# Patient Record
Sex: Male | Born: 1980 | Race: White | Hispanic: No | Marital: Single | Smoking: Current every day smoker
Health system: Southern US, Community
[De-identification: ages and names within clinical notes are randomized; demographics above are authoritative.]

## PROBLEM LIST (undated history)

## (undated) DIAGNOSIS — J189 Pneumonia, unspecified organism: Secondary | ICD-10-CM

## (undated) HISTORY — PX: KNEE SURGERY: SHX244

## (undated) HISTORY — PX: CIRCUMCISION: SUR203

## (undated) HISTORY — PX: FOOT SURGERY: SHX648

---
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
## 2002-09-11 ENCOUNTER — Emergency Department (HOSPITAL_COMMUNITY): Admission: EM | Admit: 2002-09-11 | Discharge: 2002-09-11 | Payer: Self-pay | Admitting: Emergency Medicine

## 2002-09-11 ENCOUNTER — Encounter: Payer: Self-pay | Admitting: Emergency Medicine

## 2002-09-24 ENCOUNTER — Ambulatory Visit (HOSPITAL_BASED_OUTPATIENT_CLINIC_OR_DEPARTMENT_OTHER): Admission: RE | Admit: 2002-09-24 | Discharge: 2002-09-24 | Payer: Self-pay | Admitting: Urology

## 2002-10-05 ENCOUNTER — Encounter: Payer: Self-pay | Admitting: Emergency Medicine

## 2002-10-05 ENCOUNTER — Emergency Department (HOSPITAL_COMMUNITY): Admission: EM | Admit: 2002-10-05 | Discharge: 2002-10-05 | Payer: Self-pay | Admitting: Emergency Medicine

## 2002-12-23 ENCOUNTER — Encounter: Payer: Self-pay | Admitting: Emergency Medicine

## 2002-12-23 ENCOUNTER — Emergency Department (HOSPITAL_COMMUNITY): Admission: EM | Admit: 2002-12-23 | Discharge: 2002-12-23 | Payer: Self-pay | Admitting: Emergency Medicine

## 2004-08-02 ENCOUNTER — Emergency Department (HOSPITAL_COMMUNITY): Admission: EM | Admit: 2004-08-02 | Discharge: 2004-08-02 | Payer: Self-pay | Admitting: Emergency Medicine

## 2004-09-27 ENCOUNTER — Emergency Department (HOSPITAL_COMMUNITY): Admission: EM | Admit: 2004-09-27 | Discharge: 2004-09-27 | Payer: Self-pay | Admitting: Emergency Medicine

## 2007-03-18 ENCOUNTER — Emergency Department (HOSPITAL_COMMUNITY): Admission: EM | Admit: 2007-03-18 | Discharge: 2007-03-18 | Payer: Self-pay | Admitting: Emergency Medicine

## 2010-04-16 ENCOUNTER — Emergency Department (HOSPITAL_BASED_OUTPATIENT_CLINIC_OR_DEPARTMENT_OTHER)
Admission: EM | Admit: 2010-04-16 | Discharge: 2010-04-16 | Payer: Self-pay | Source: Home / Self Care | Admitting: Emergency Medicine

## 2010-09-03 NOTE — Op Note (Signed)
NAME:  [name], Juan Mann                         ACCOUNT NO.:  [id number]   MEDICAL RECORD NO.:  [id number]                   PATIENT TYPE:  AMB   LOCATION:  [facility]                                 FACILITY:  [facility]   PHYSICIAN:  Excell Seltzer. Annabell Howells, M.D.                 DATE OF BIRTH:  1981/04/18   DATE OF PROCEDURE:  09/24/2002  DATE OF DISCHARGE:                                 OPERATIVE REPORT   PROCEDURE:  Right hydrocelectomy.   PREOPERATIVE DIAGNOSIS:  Traumatic right hydrocele.   POSTOPERATIVE DIAGNOSIS:  Traumatic right hydrocele.   SURGEON:  Excell Seltzer. Annabell Howells, M.D.   ANESTHESIA:  General.   DRAINS:  Penrose drain.   COMPLICATIONS:  None.   INDICATIONS:  Onyx is a 30 year old white male who had the sudden onset of  right scrotal swelling and pain after heavy lifting at work.  On evaluation  with ultrasound he was found to have a large hydrocele and otherwise  unremarkable testicle.  It was felt that hydrocelectomy was indicated.   FINDINGS AND PROCEDURE:  The patient was taken to the operating room, where  a general anesthetic was induced.  He was placed in the supine position.  His anterior scrotum and right inguinal area were shaved, as I was not sure  whether this might not have a communication or possibly be a small hernia.  An oblique incision was made over the hydrocele, which had a somewhat  interesting dumbbell configuration.  The incision was made obliquely on the  right anterior scrotum.  The dartos was incised with the Bovie and the  hydrocele sac was delivered from the wound.  The hydrocele sac was opened.  Close inspection revealed no communicating tract with the inguinal area.  Some residual excess hydrocele sac was excised and then the hydrocele was  imbricated behind the testicle in a water bottle fashion.  At this point the  residual epithelium was lightly fulgurated with the Bovie.  Hemostasis was  assured.  The testicle was returned to the right hemiscrotum.   A quarter-  inch Penrose drain was placed through a separate stab wound in the inferior  aspect of the scrotum.  The wound was closed with a running 3-0 chromic on  the dartos and interrupted vertical mattress on the skin.  The drain was  trimmed once it was felt certain it was not secured with suture, and a  dressing was applied followed by an athletic supporter.  The patient  tolerated the procedure well.  His anesthetic was reversed.  He was taken to  the recovery room in stable condition.  There were no complications.                                               Excell Seltzer.  Annabell Howells, M.D.    JJW/MEDQ  D:  09/24/2002  T:  09/24/2002  Job:  [id number]   cc:   [facility]

## 2010-10-21 ENCOUNTER — Emergency Department (HOSPITAL_BASED_OUTPATIENT_CLINIC_OR_DEPARTMENT_OTHER)
Admission: EM | Admit: 2010-10-21 | Discharge: 2010-10-21 | Disposition: A | Discharge status: Home / Self Care | Payer: Self-pay | Attending: Emergency Medicine | Admitting: Emergency Medicine

## 2010-10-21 ENCOUNTER — Emergency Department (INDEPENDENT_AMBULATORY_CARE_PROVIDER_SITE_OTHER): Payer: Self-pay

## 2010-10-21 DIAGNOSIS — IMO0002 Reserved for concepts with insufficient information to code with codable children: Secondary | ICD-10-CM | POA: Insufficient documentation

## 2010-10-21 DIAGNOSIS — M25549 Pain in joints of unspecified hand: Secondary | ICD-10-CM

## 2010-10-21 DIAGNOSIS — J45909 Unspecified asthma, uncomplicated: Secondary | ICD-10-CM | POA: Insufficient documentation

## 2010-10-21 DIAGNOSIS — S6000XA Contusion of unspecified finger without damage to nail, initial encounter: Secondary | ICD-10-CM | POA: Insufficient documentation

## 2010-12-27 ENCOUNTER — Encounter: Payer: Self-pay | Admitting: *Deleted

## 2010-12-27 ENCOUNTER — Other Ambulatory Visit: Payer: Self-pay

## 2010-12-27 ENCOUNTER — Emergency Department (INDEPENDENT_AMBULATORY_CARE_PROVIDER_SITE_OTHER): Payer: Self-pay

## 2010-12-27 ENCOUNTER — Emergency Department (HOSPITAL_BASED_OUTPATIENT_CLINIC_OR_DEPARTMENT_OTHER)
Admission: EM | Admit: 2010-12-27 | Discharge: 2010-12-27 | Disposition: A | Discharge status: Home / Self Care | Payer: Self-pay | Attending: Emergency Medicine | Admitting: Emergency Medicine

## 2010-12-27 DIAGNOSIS — R509 Fever, unspecified: Secondary | ICD-10-CM

## 2010-12-27 DIAGNOSIS — J189 Pneumonia, unspecified organism: Secondary | ICD-10-CM

## 2010-12-27 DIAGNOSIS — R079 Chest pain, unspecified: Secondary | ICD-10-CM

## 2010-12-27 DIAGNOSIS — R05 Cough: Secondary | ICD-10-CM

## 2010-12-27 DIAGNOSIS — J3489 Other specified disorders of nose and nasal sinuses: Secondary | ICD-10-CM | POA: Insufficient documentation

## 2010-12-27 LAB — URINE MICROSCOPIC-ADD ON

## 2010-12-27 LAB — CBC
HCT: 42.1 % (ref 39.0–52.0)
MCV: 86.1 fL (ref 78.0–100.0)
Platelets: 282 10*3/uL (ref 150–400)
RBC: 4.89 MIL/uL (ref 4.22–5.81)

## 2010-12-27 LAB — URINALYSIS, ROUTINE W REFLEX MICROSCOPIC
Specific Gravity, Urine: 1.037 — ABNORMAL HIGH (ref 1.005–1.030)
Urobilinogen, UA: 1 mg/dL (ref 0.0–1.0)

## 2010-12-27 LAB — BASIC METABOLIC PANEL
BUN: 11 mg/dL (ref 6–23)
CO2: 24 mEq/L (ref 19–32)
Calcium: 9.9 mg/dL (ref 8.4–10.5)
Glucose, Bld: 101 mg/dL — ABNORMAL HIGH (ref 70–99)
Sodium: 140 mEq/L (ref 135–145)

## 2010-12-27 LAB — DIFFERENTIAL
Basophils Absolute: 0 10*3/uL (ref 0.0–0.1)
Eosinophils Relative: 1 % (ref 0–5)
Monocytes Absolute: 0.9 10*3/uL (ref 0.1–1.0)
Neutrophils Relative %: 81 % — ABNORMAL HIGH (ref 43–77)

## 2010-12-27 MED ORDER — ALBUTEROL SULFATE HFA 108 (90 BASE) MCG/ACT IN AERS
2.0000 | INHALATION_SPRAY | RESPIRATORY_TRACT | Status: DC | PRN
  Administered 2010-12-27: 2 via RESPIRATORY_TRACT
  Filled 2010-12-27: qty 6.7

## 2010-12-27 MED ORDER — SODIUM CHLORIDE 0.9 % IV BOLUS (SEPSIS)
1000.0000 mL | Freq: Once | INTRAVENOUS | Status: AC
  Administered 2010-12-27: 1000 mL via INTRAVENOUS

## 2010-12-27 MED ORDER — DEXTROSE 5 % IV SOLN
500.0000 mg | Freq: Once | INTRAVENOUS | Status: AC
  Administered 2010-12-27: 500 mg via INTRAVENOUS
  Filled 2010-12-27: qty 500

## 2010-12-27 MED ORDER — SODIUM CHLORIDE 0.9 % IV SOLN
INTRAVENOUS | Status: DC
  Administered 2010-12-27: 13:00:00 via INTRAVENOUS

## 2010-12-27 MED ORDER — AZITHROMYCIN 250 MG PO TABS: 250.0000 mg | ORAL_TABLET | Freq: Every day | ORAL | Status: AC

## 2010-12-27 NOTE — ED Provider Notes (Signed)
History     CSN: [id number] Arrival date & time: 12/27/2010  9:26 AM  Chief Complaint  Patient presents with  . Fever  . Nasal Congestion  . Cough   HPI Pt reports about 2 weeks of progressively worsening, cough, congestion, general malaise, subjective fever. Cough is productive of thick sputum, similar to prior pneumonia. He has recently started smoking again. He has had some intermittent sharp L sided chest pains worse with movement, palpation.  Past Medical History  Diagnosis Date  . Asthma     History reviewed. No pertinent past surgical history.  History reviewed. No pertinent family history.  History  Substance Use Topics  . Smoking status: Current Everyday Smoker  . Smokeless tobacco: Not on file  . Alcohol Use:       Review of Systems All other systems reviewed and are negative except as noted in HPI.   Physical Exam  BP 134/85  Pulse 78  Temp(Src) 98 F (36.7 C) (Oral)  Resp 20  SpO2 100%  Physical Exam  Nursing note and vitals reviewed. Constitutional: He is oriented to person, place, and time. He appears well-developed and well-nourished.  HENT:  Head: Normocephalic and atraumatic.  Eyes: EOM are normal. Pupils are equal, round, and reactive to light.  Neck: Normal range of motion. Neck supple.  Cardiovascular: Normal rate, normal heart sounds and intact distal pulses.   Pulmonary/Chest: Effort normal. No respiratory distress. He has wheezes. He has rales.  Abdominal: Bowel sounds are normal. He exhibits no distension. There is no tenderness.  Musculoskeletal: Normal range of motion. He exhibits no edema and no tenderness.  Neurological: He is alert and oriented to person, place, and time. He has normal strength. No cranial nerve deficit or sensory deficit.  Skin: Skin is warm and dry. No rash noted.  Psychiatric: He has a normal mood and affect.    ED Course  Procedures  MDM  Date: 12/27/2010  Rate: 58  Rhythm: normal sinus rhythm  QRS Axis:  normal  Intervals: PR shortened  ST/T Wave abnormalities: normal  Conduction Disutrbances:none  Narrative Interpretation:   Old EKG Reviewed: unchanged from 02 Aug 2004   12:59 PM CXR shows pneumonia, mild leukocytosis. Pt feeling better after IVF. Ready to go home. Will give Zithromax IV prior to discharge. Albuterol given in the ED to take home. Advised to stop smoking.      Charles B. Bernette Mayers, MD 12/27/10 1300

## 2010-12-27 NOTE — ED Notes (Signed)
Pt amb to room 7 with slow, steady gait in nad.  Pt reports 2 weeks of cough, congestion and fevers.  Also c/o left sided chest pain off and on x 1 year, describes as "sharp", tender to touch, ekg performed while pt being triaged.

## 2011-01-25 LAB — URINALYSIS, ROUTINE W REFLEX MICROSCOPIC
Glucose, UA: NEGATIVE
Specific Gravity, Urine: 1.028
pH: 6

## 2011-01-29 ENCOUNTER — Emergency Department (HOSPITAL_BASED_OUTPATIENT_CLINIC_OR_DEPARTMENT_OTHER)
Admission: EM | Admit: 2011-01-29 | Discharge: 2011-01-29 | Disposition: A | Discharge status: Home / Self Care | Payer: Self-pay | Attending: Emergency Medicine | Admitting: Emergency Medicine

## 2011-01-29 ENCOUNTER — Encounter (HOSPITAL_BASED_OUTPATIENT_CLINIC_OR_DEPARTMENT_OTHER): Payer: Self-pay | Admitting: *Deleted

## 2011-01-29 DIAGNOSIS — J40 Bronchitis, not specified as acute or chronic: Secondary | ICD-10-CM | POA: Insufficient documentation

## 2011-01-29 DIAGNOSIS — R0602 Shortness of breath: Secondary | ICD-10-CM | POA: Insufficient documentation

## 2011-01-29 DIAGNOSIS — J45909 Unspecified asthma, uncomplicated: Secondary | ICD-10-CM | POA: Insufficient documentation

## 2011-01-29 HISTORY — DX: Pneumonia, unspecified organism: J18.9

## 2011-01-29 MED ORDER — AZITHROMYCIN 250 MG PO TABS: ORAL_TABLET | ORAL | Status: DC

## 2011-01-29 MED ORDER — DEXAMETHASONE 2 MG PO TABS: ORAL_TABLET | ORAL | Status: DC

## 2011-01-29 MED ORDER — ALBUTEROL SULFATE (5 MG/ML) 0.5% IN NEBU
5.0000 mg | INHALATION_SOLUTION | Freq: Once | RESPIRATORY_TRACT | Status: AC
  Administered 2011-01-29: 5 mg via RESPIRATORY_TRACT
  Filled 2011-01-29: qty 1

## 2011-01-29 MED ORDER — IPRATROPIUM BROMIDE 0.02 % IN SOLN
0.5000 mg | Freq: Once | RESPIRATORY_TRACT | Status: AC
  Administered 2011-01-29: 0.5 mg via RESPIRATORY_TRACT
  Filled 2011-01-29: qty 2.5

## 2011-01-29 MED ORDER — DEXAMETHASONE 4 MG PO TABS
10.0000 mg | ORAL_TABLET | Freq: Once | ORAL | Status: AC
  Administered 2011-01-29: 10 mg via ORAL
  Filled 2011-01-29: qty 3

## 2011-01-29 MED ORDER — ALBUTEROL SULFATE HFA 108 (90 BASE) MCG/ACT IN AERS
2.0000 | INHALATION_SPRAY | RESPIRATORY_TRACT | Status: DC | PRN
  Administered 2011-01-29: 2 via RESPIRATORY_TRACT
  Filled 2011-01-29: qty 6.7

## 2011-01-29 NOTE — ED Notes (Signed)
Pt states he has had fever , chills,H/A, decreased appetite, cough and weakness since last Weds.

## 2011-01-29 NOTE — ED Provider Notes (Signed)
History    Scribed for Hanley Seamen, MD, the patient was seen in room MH07/MH07. This chart was scribed by Katha Cabal. This patient's care was started at 8:08 PM.    CSN: [id number] Arrival date & time: 01/29/2011  7:53 PM  Chief Complaint  Patient presents with  . Shortness of Breath    (Consider location/radiation/quality/duration/timing/severity/associated sxs/prior treatment) HPI Juan Mann is a 30 y.o. male with hx of asthma presents to the Emergency Department complaining of gradual onset of SOB with associated rhinorrhea, sneezing, fever, chills, headache, decreased appetite, cough and fatigue for past 4 days.  Denies vomiting.  Patient does not have an inhaler to use.  Patient took Rexall with minimal relief.  Headache persists in ED.  Patient had PNA twice in the last year.   Reports current sx similar to PNA episode.  Patient last had PNA about 3 months ago.      Past Medical History  Diagnosis Date  . Asthma   . Pneumonia     Past Surgical History  Procedure Date  . Foot surgery   . Knee surgery   . Circumcision     History reviewed. No pertinent family history.  History  Substance Use Topics  . Smoking status: Current Everyday Smoker  . Smokeless tobacco: Not on file  . Alcohol Use: Yes      Review of Systems  All other systems reviewed and are negative.    Allergies  Review of patient's allergies indicates no known allergies.  Home Medications   Current Outpatient Rx  Name Route Sig Dispense Refill  . ALBUTEROL SULFATE (2.5 MG/3ML) 0.083% IN NEBU Nebulization Take 2.5 mg by nebulization once.      Marland Kitchen DM-PHENYLEPHRINE-ACETAMINOPHEN 10-5-325 MG PO CAPS Oral Take 2 capsules by mouth every 6 (six) hours as needed. For congestion     . AZITHROMYCIN 250 MG PO TABS  Take 2 tablets today then one daily for 4 days. 6 tablet 0  . DEXAMETHASONE 2 MG PO TABS  Take all 5 tablets on the evening of Sunday, October 14.  5 tablet 0    BP 147/90   Pulse 114  Temp(Src) 98.6 F (37 C) (Oral)  Resp 20  Ht 5\' 11"  (1.803 m)  Wt 200 lb (90.719 kg)  BMI 27.89 kg/m2  SpO2 96%  Physical Exam General: Well-developed, well-nourished male in no acute distress; appearance consistent with age of record HENT: normocephalic, atraumatic, TMs normal, Coated tongue, Pharynx nlm, No erythema no exudate  Eyes: pupils equal round and reactive to light; extraocular muscles intact Neck: supple, no cervical lymphadenopathy  Heart: regular rate and rhythm; no murmurs, rubs or gallops Lungs: slight expiratory wheezes, loudest in right base  Abdomen: soft; nontender; nondistended; no masses or hepatosplenomegaly; bowel sounds present Extremities: No deformity; full range of motion; pulses normal Neurologic: Awake, alert and oriented;motor function intact in all extremities and symmetric; no facial droop Skin: Warm and dry Psychiatric: Normal mood and affect   ED Course  Procedures (including critical care time)   DIAGNOSTIC STUDIES: Oxygen Saturation is 100% on room air, normal by my interpretation.    COORDINATION OF CARE: 8:10 PM  Patient receiving breathing treatment.  Minor wheezing heard in lungs.  Will return to complete physical exam.   8:31 PM  Physical exam complete.  Will give patient inhaler.      IMPRESSION: 1. Bronchitis             Hanley Seamen, MD 01/30/11  0314 

## 2011-02-02 ENCOUNTER — Encounter (HOSPITAL_COMMUNITY): Payer: Self-pay | Admitting: *Deleted

## 2011-02-02 ENCOUNTER — Emergency Department (HOSPITAL_COMMUNITY): Payer: Self-pay

## 2011-02-02 ENCOUNTER — Emergency Department (HOSPITAL_COMMUNITY)
Admission: EM | Admit: 2011-02-02 | Discharge: 2011-02-02 | Disposition: A | Discharge status: Home / Self Care | Payer: Self-pay | Attending: Emergency Medicine | Admitting: Emergency Medicine

## 2011-02-02 DIAGNOSIS — J189 Pneumonia, unspecified organism: Secondary | ICD-10-CM | POA: Insufficient documentation

## 2011-02-02 DIAGNOSIS — J45909 Unspecified asthma, uncomplicated: Secondary | ICD-10-CM | POA: Insufficient documentation

## 2011-02-02 DIAGNOSIS — F17200 Nicotine dependence, unspecified, uncomplicated: Secondary | ICD-10-CM | POA: Insufficient documentation

## 2011-02-02 MED ORDER — AZITHROMYCIN 250 MG PO TABS: ORAL_TABLET | ORAL | Status: DC

## 2011-02-02 MED ORDER — ALBUTEROL SULFATE (5 MG/ML) 0.5% IN NEBU
2.5000 mg | INHALATION_SOLUTION | Freq: Once | RESPIRATORY_TRACT | Status: AC
  Administered 2011-02-02: 2.5 mg via RESPIRATORY_TRACT
  Filled 2011-02-02: qty 0.5

## 2011-02-02 NOTE — ED Notes (Signed)
Pt presents to Ed today with c/o SOB, fever and chills. Pt is diaphoretic and has moist productive cough. Pt states was seen at [facility] last Wednesday, received an inhaler and z-pack.

## 2011-02-02 NOTE — ED Provider Notes (Signed)
History     CSN: [id number] Arrival date & time: 02/02/2011 12:35 PM   First MD Initiated Contact with Patient 02/02/11 1323      Chief Complaint  Patient presents with  . Shortness of Breath    (Consider location/radiation/quality/duration/timing/severity/associated sxs/prior treatment) HPI The patient notes the gradual onset of chest discomfort, and cough approximately one week ago. Since onset the symptoms have been progressive, and the patient has developed new fevers and chills. He has been to both this ED, and in and out of hospitals since the onset he notes that he was diagnosed most recently with bronchitis, and he received and inhaler and azithromycin. He notes no improvement in his symptoms with these medications. Symptoms seem to be worsening regardless. Past Medical History  Diagnosis Date  . Asthma   . Pneumonia     Past Surgical History  Procedure Date  . Foot surgery   . Knee surgery   . Circumcision     History reviewed. No pertinent family history.  History  Substance Use Topics  . Smoking status: Current Everyday Smoker -- 0.5 packs/day    Types: Cigarettes  . Smokeless tobacco: Not on file  . Alcohol Use: Yes      Review of Systems Gen: Per HPI HEENT: No HA CV: hpi Resp: hpi Abd: Per HPI, otherwise negative Musk: Per HPI, otherwise negative Neuro: No dysesthesia, or focal changes GU: Per HPI, otherwise negative Skin: Neg Psych: Neg  Allergies  Review of patient's allergies indicates no known allergies.  Home Medications   Current Outpatient Rx  Name Route Sig Dispense Refill  . ALBUTEROL SULFATE (2.5 MG/3ML) 0.083% IN NEBU Nebulization Take 2.5 mg by nebulization once.      . AZITHROMYCIN 250 MG PO TABS  Take 2 tablets today then one daily for 4 days. 6 tablet 0  . DEXAMETHASONE 2 MG PO TABS  Take all 5 tablets on the evening of Sunday, October 14.  5 tablet 0  . DM-PHENYLEPHRINE-ACETAMINOPHEN 10-5-325 MG PO CAPS Oral Take 2 capsules  by mouth every 6 (six) hours as needed. For congestion       BP 144/86  Pulse 88  Temp(Src) 98.1 F (36.7 C) (Oral)  Resp 22  Ht 5\' 11"  (1.803 m)  Wt 205 lb (92.987 kg)  BMI 28.59 kg/m2  SpO2 93%  Physical Exam  Constitutional: He is oriented to person, place, and time. He appears well-developed and well-nourished. He is not intubated.  HENT:  Head: Normocephalic and atraumatic.  Eyes: Conjunctivae are normal. Pupils are equal, round, and reactive to light.  Neck: Neck supple.  Cardiovascular: Normal rate and regular rhythm.   Pulmonary/Chest: No accessory muscle usage. No apnea, not tachypneic and not bradypneic. He is not intubated. No respiratory distress. He has wheezes. He has rhonchi in the right upper field, the right middle field, the right lower field, the left upper field, the left middle field and the left lower field.  Abdominal: Soft. There is no tenderness.  Musculoskeletal: He exhibits no edema.  Neurological: He is alert and oriented to person, place, and time.  Skin: Skin is warm. He is diaphoretic.  Psychiatric: He has a normal mood and affect.    ED Course  Procedures (including critical care time)  Labs Reviewed - No data to display No results found.   No diagnosis found.    MDM   This 30 year old male presents with ongoing dyspnea and cough and generalized discomfort. On exam the patient is mildly  diaphoretic with course breath sounds throughout. He notes a recent diagnosis of bronchitis. Records indicate the patient was clinically diagnosed with pneumonia. The patient's condition improved following a nebulizer treatment. His x-rays were notable for a pneumonia, resolving. All results were discussed with the patient, as well as a recommendation for both PMD followup, and pulmonology referral for what seems to be a new recurring seem of pneumonia in this previously healthy young male. He will be prescribed an additional 5 days of antibiotics and be  discharged.   Gerhard Munch, MD 02/02/11 [phone]

## 2011-02-02 NOTE — ED Notes (Signed)
Pt remains diaphoretic, pale with flushed cheeks , respiratory status has improved slightly. BBS with fine crackles at this time, rhinitis with clear mucus.

## 2011-02-02 NOTE — ED Notes (Signed)
RTT paged and notified of nebulizer treatment

## 2011-06-21 ENCOUNTER — Encounter: Payer: Self-pay | Admitting: *Deleted

## 2011-06-21 ENCOUNTER — Emergency Department: Admission: EM | Admit: 2011-06-21 | Discharge: 2011-06-21 | Payer: Self-pay | Source: Home / Self Care

## 2011-10-14 ENCOUNTER — Emergency Department (HOSPITAL_BASED_OUTPATIENT_CLINIC_OR_DEPARTMENT_OTHER)
Admission: EM | Admit: 2011-10-14 | Discharge: 2011-10-14 | Disposition: A | Discharge status: Home / Self Care | Payer: Self-pay | Attending: Emergency Medicine | Admitting: Emergency Medicine

## 2011-10-14 ENCOUNTER — Encounter (HOSPITAL_BASED_OUTPATIENT_CLINIC_OR_DEPARTMENT_OTHER): Payer: Self-pay

## 2011-10-14 DIAGNOSIS — T6391XA Toxic effect of contact with unspecified venomous animal, accidental (unintentional), initial encounter: Secondary | ICD-10-CM | POA: Insufficient documentation

## 2011-10-14 DIAGNOSIS — F17200 Nicotine dependence, unspecified, uncomplicated: Secondary | ICD-10-CM | POA: Insufficient documentation

## 2011-10-14 DIAGNOSIS — J45909 Unspecified asthma, uncomplicated: Secondary | ICD-10-CM | POA: Insufficient documentation

## 2011-10-14 DIAGNOSIS — T63441A Toxic effect of venom of bees, accidental (unintentional), initial encounter: Secondary | ICD-10-CM

## 2011-10-14 DIAGNOSIS — T63461A Toxic effect of venom of wasps, accidental (unintentional), initial encounter: Secondary | ICD-10-CM | POA: Insufficient documentation

## 2011-10-14 MED ORDER — FAMOTIDINE IN NACL 20-0.9 MG/50ML-% IV SOLN
20.0000 mg | Freq: Once | INTRAVENOUS | Status: AC
  Administered 2011-10-14: 20 mg via INTRAVENOUS
  Filled 2011-10-14: qty 50

## 2011-10-14 MED ORDER — DIPHENHYDRAMINE HCL 50 MG/ML IJ SOLN
25.0000 mg | Freq: Once | INTRAMUSCULAR | Status: AC
  Administered 2011-10-14: 25 mg via INTRAVENOUS
  Filled 2011-10-14: qty 1

## 2011-10-14 MED ORDER — METHYLPREDNISOLONE SODIUM SUCC 125 MG IJ SOLR
125.0000 mg | Freq: Once | INTRAMUSCULAR | Status: AC
  Administered 2011-10-14: 125 mg via INTRAVENOUS
  Filled 2011-10-14: qty 2

## 2011-10-14 MED ORDER — PREDNISONE 10 MG PO TABS: 40.0000 mg | ORAL_TABLET | Freq: Every day | ORAL | Status: DC

## 2011-10-14 MED ORDER — EPINEPHRINE 0.3 MG/0.3ML IJ DEVI: 0.3000 mg | Freq: Once | INTRAMUSCULAR | Status: DC

## 2011-10-14 NOTE — ED Provider Notes (Addendum)
History     CSN: [id number]  Arrival date & time 10/14/11  1722   First MD Initiated Contact with Patient 10/14/11 1739      Chief Complaint  Patient presents with  . Insect Bite    (Consider location/radiation/quality/duration/timing/severity/associated sxs/prior treatment) HPI Comments: Patient comes in with complaint of a bee sting. States that he was stung on the right side of his chin approximately 20 minutes prior to arrival. He has no past history is of allergic reaction to bees. However, his mom has severe bee allergy. He does have a history of asthma. Since the sting, he has felt like his right face  has gotten more swollen. Gotten, swollen. He's had some swelling in his mouth. Has had a little bit of difficulty swallowing. Denies any shortness of breath. Denies any wheezing or hives. He took an allergy pill prior to arrival and he states his symptoms are starting to get better.  The history is provided by the patient.    Past Medical History  Diagnosis Date  . Asthma   . Pneumonia     Past Surgical History  Procedure Date  . Foot surgery   . Knee surgery   . Circumcision     No family history on file.  History  Substance Use Topics  . Smoking status: Current Everyday Smoker -- 0.5 packs/day    Types: Cigarettes  . Smokeless tobacco: Not on file  . Alcohol Use: Yes      Review of Systems  Constitutional: Negative for fever, chills, diaphoresis and fatigue.  HENT: Negative for congestion, rhinorrhea and sneezing.   Eyes: Negative.   Respiratory: Negative for cough, chest tightness and shortness of breath.   Cardiovascular: Negative for chest pain and leg swelling.  Gastrointestinal: Negative for nausea, vomiting, abdominal pain, diarrhea and blood in stool.  Genitourinary: Negative for frequency, hematuria, flank pain and difficulty urinating.  Musculoskeletal: Negative for back pain and arthralgias.  Skin: Negative for rash.  Neurological: Negative for  dizziness, speech difficulty, weakness, numbness and headaches.    Allergies  Review of patient's allergies indicates no known allergies.  Home Medications   Current Outpatient Rx  Name Route Sig Dispense Refill  . ALBUTEROL SULFATE HFA 108 (90 BASE) MCG/ACT IN AERS Inhalation Inhale 2 puffs into the lungs every 6 (six) hours as needed. Patient used this medication for shortness of breath.    . ALBUTEROL SULFATE (2.5 MG/3ML) 0.083% IN NEBU Nebulization Take 2.5 mg by nebulization once.      Marland Kitchen EPINEPHRINE 0.3 MG/0.3ML IJ DEVI Intramuscular Inject 0.3 mLs (0.3 mg total) into the muscle once. 1 Device 0  . PREDNISONE 10 MG PO TABS Oral Take 4 tablets (40 mg total) by mouth daily. 20 tablet 0    BP 131/78  Pulse 97  Resp 16  SpO2 98%  Physical Exam  Constitutional: He is oriented to person, place, and time. He appears well-developed and well-nourished.  HENT:  Head: Normocephalic and atraumatic.       He has a very punctate red marks to his left chin consistent with a bee sting with surrounding swelling to the right mandible and cheek area. There is no elevation of the tongue. No noticeable swelling of the tongue or lips. No trismus  Eyes: Pupils are equal, round, and reactive to light.  Neck: Normal range of motion. Neck supple.  Cardiovascular: Normal rate, regular rhythm and normal heart sounds.   Pulmonary/Chest: Effort normal and breath sounds normal. No respiratory distress.  He has no wheezes. He has no rales. He exhibits no tenderness.  Abdominal: Soft. Bowel sounds are normal. There is no tenderness. There is no rebound and no guarding.  Musculoskeletal: Normal range of motion. He exhibits no edema.  Lymphadenopathy:    He has no cervical adenopathy.  Neurological: He is alert and oriented to person, place, and time.  Skin: Skin is warm and dry. No rash noted.  Psychiatric: He has a normal mood and affect.    ED Course  Procedures (including critical care time)  Labs  Reviewed - No data to display No results found.   1. Bee sting       MDM  Pt feeling much better after solumedrol, benadryl, pepcid.  No trouble swallowing.  Swelling down.  Will give rx for prednisone, and epipen to use as needed        Rolan Bucco, MD 10/14/11 Cindy Hazy, MD 10/14/11 1610

## 2011-10-14 NOTE — Discharge Instructions (Signed)
Bee, Wasp, or Hornet Sting   Your caregiver has diagnosed you as having an insect sting. An insect sting appears as a red lump in the skin that sometimes has a tiny hole in the center, or it may have a stinger in the center of the wound. The most common stings are from wasps, hornets and bees.   Individuals have different reactions to insect stings.   A normal reaction may cause pain, swelling, and redness around the sting site.   A localized allergic reaction may cause swelling and redness that extends beyond the sting site.   A large local reaction may continue to develop over the next 12 to 36 hours.   On occasion, the reactions can be severe (anaphylactic reaction). An anaphylactic reaction may cause wheezing; difficulty breathing; chest pain; fainting; raised, itchy, red patches on the skin; a sick feeling to your stomach (nausea); vomiting; cramping; or diarrhea. If you have had an anaphylactic reaction to an insect sting in the past, you are more likely to have one again.   HOME CARE INSTRUCTIONS   With bee stings, a small sac of poison is left in the wound. Brushing across this with something such as a credit card, or anything similar, will help remove this and decrease the amount of the reaction. This same procedure will not help a wasp sting as they do not leave behind a stinger and poison sac.   Apply a cold compress for 10 to 20 minutes every hour for 1 to 2 days, depending on severity, to reduce swelling and itching.   To lessen pain, a paste made of water and baking soda may be rubbed on the bite or sting and left on for 5 minutes.   To relieve itching and swelling, you may use take medication or apply medicated creams or lotions as directed.   Only take over-the-counter or prescription medicines for pain, discomfort, or fever as directed by your caregiver.   Wash the sting site daily with soap and water. Apply antibiotic ointment on the sting site as directed.   If you suffered a severe reaction:   If  you did not require hospitalization, an adult will need to stay with you for 24 hours in case the symptoms return.   You may need to wear a medical bracelet or necklace stating the allergy.   You and your family need to learn when and how to use an anaphylaxis kit or epinephrine injection.   If you have had a severe reaction before, always carry your anaphylaxis kit with you.   SEEK MEDICAL CARE IF:   None of the above helps within 2 to 3 days.   The area becomes red, warm, tender, and swollen beyond the area of the bite or sting.   You have an oral temperature above 102° F (38.9° C).   SEEK IMMEDIATE MEDICAL CARE IF:   You have symptoms of an allergic reaction which are:   Wheezing.   Difficulty breathing.   Chest pain.   Lightheadedness or fainting.   Itchy, raised, red patches on the skin.   Nausea, vomiting, cramping or diarrhea.   ANY OF THESE SYMPTOMS MAY REPRESENT A SERIOUS PROBLEM THAT IS AN EMERGENCY. Do not wait to see if the symptoms will go away. Get medical help right away. Call your local emergency services (911 in [place] DO NOT drive yourself to the hospital.   MAKE SURE YOU:   Understand these instructions.   Will watch your condition.     Will get help right away if you are not doing well or get worse.   Document Released: 04/04/2005 Document Revised: 03/24/2011 Document Reviewed: 09/19/2009   [employer]® Patient Information ©2012 [employer].

## 2011-10-14 NOTE — ED Notes (Signed)
Bee sting to face approx 20 min PTA-c/o feeling like throat swelling-NAD noted

## 2011-11-12 IMAGING — CR DG CHEST 2V
2 series · 2 of 2 positions shown · non-contrast
Comparison: Chest x-ray of 12/27/2010

CLINICAL DATA: Cough, congestion, shortness of breath

CHEST - 2 VIEW

[view not recorded (1 of 2)]
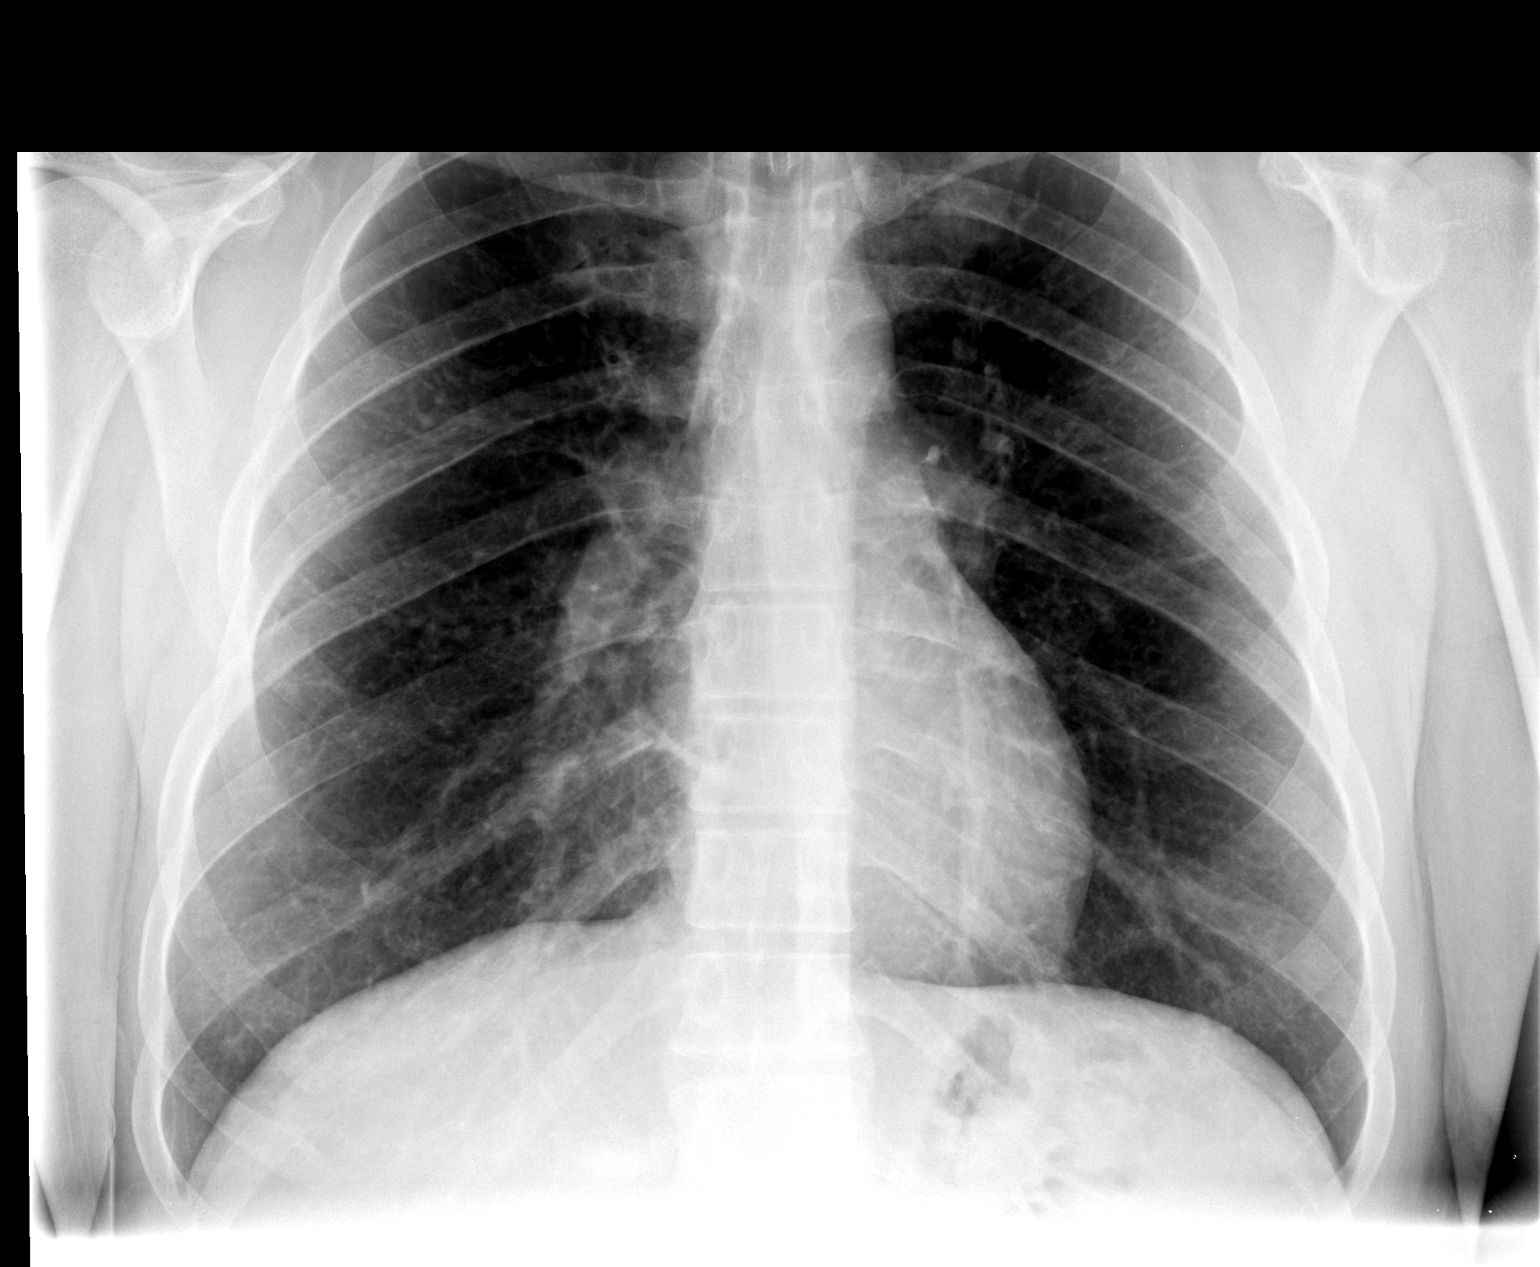

[view not recorded (2 of 2)]
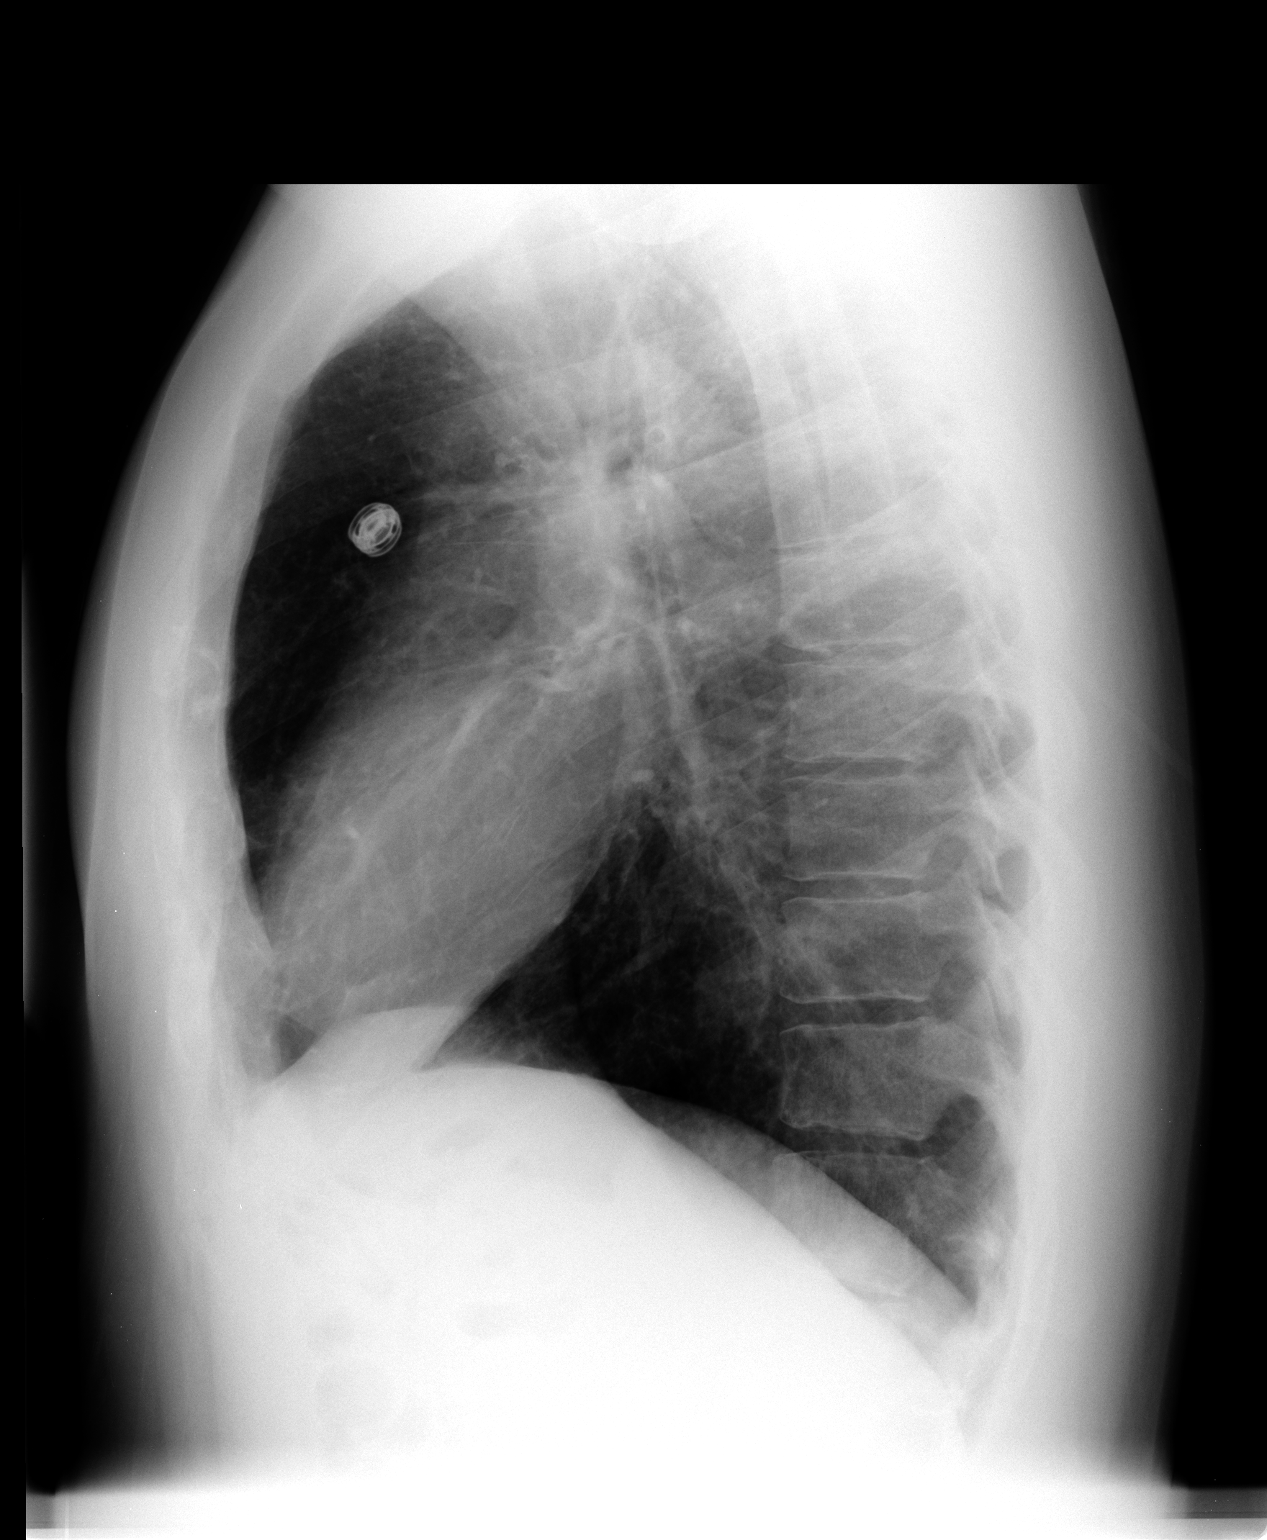

[2 of 2 positions shown; findings below may reference images not displayed]

FINDINGS: The right middle lobe infiltrate has cleared.  No active
infiltrate or effusion is seen.  The lungs remain slightly
hyperaerated.  Mediastinal contours are stable.  The heart is
within normal limits in size.  No bony abnormality is seen.
IMPRESSION: Clearing of right middle lobe pneumonia.  No active process.

## 2012-07-25 ENCOUNTER — Ambulatory Visit (INDEPENDENT_AMBULATORY_CARE_PROVIDER_SITE_OTHER): Payer: No Typology Code available for payment source | Admitting: Physician Assistant

## 2012-07-25 ENCOUNTER — Encounter: Payer: Self-pay | Admitting: Physician Assistant

## 2012-07-25 VITALS — BP 132/84 | HR 71 | Ht 71.0 in | Wt 171.0 lb

## 2012-07-25 DIAGNOSIS — J45909 Unspecified asthma, uncomplicated: Secondary | ICD-10-CM | POA: Insufficient documentation

## 2012-07-25 DIAGNOSIS — S239XXA Sprain of unspecified parts of thorax, initial encounter: Secondary | ICD-10-CM

## 2012-07-25 DIAGNOSIS — S29012A Strain of muscle and tendon of back wall of thorax, initial encounter: Secondary | ICD-10-CM

## 2012-07-25 MED ORDER — ORPHENADRINE CITRATE ER 100 MG PO TB12: 100.0000 mg | ORAL_TABLET | Freq: Two times a day (BID) | ORAL | Status: DC

## 2012-07-25 MED ORDER — KETOROLAC TROMETHAMINE 60 MG/2ML IM SOLN
60.0000 mg | Freq: Once | INTRAMUSCULAR | Status: AC
  Administered 2012-07-25: 60 mg via INTRAMUSCULAR

## 2012-07-25 MED ORDER — TRAMADOL HCL 50 MG PO TABS: 50.0000 mg | ORAL_TABLET | Freq: Three times a day (TID) | ORAL | Status: DC | PRN

## 2012-07-25 NOTE — Patient Instructions (Addendum)
Continue ibuprofen up to 800mg  up to three times a day. Take muscle relaxer as needed up to twice a day. Ice and heat alternating. Tramadol for break through pain. Start exercises daily.   Try epson salt warm baths.

## 2012-07-25 NOTE — Progress Notes (Signed)
  Subjective:    Patient ID: Juan Mann, male    DOB: 01-12-1981, 32 y.o.   MRN: [id number]  HPI Patient is a 32 yo male who presents to the clinic to establish care. PMH is positive for asthma which is well controlled on Advair and albuterol as needed. Not had a physical in many years. [facility]. Smokes 1/2 pack daily. Denies drinking alcohol. Does not exercise regularly. Family hx positive for , ABDs, high blood pressure, hyperlipidemia. He is unaware of his last tetanus shot he has not had regular blood work done to his knowledge. Surgical history is updated.  He comes in today with pain in his right shoulder and upper back. 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 He denies hearing any popping, tearing. He did have a similar injury to his right shoulder about 2 years ago. He was put in a sling for 2 weeks and maybe for recovery. He describes the pain as 8/10 with movement. As long as he is not moving the pain is well controlled. When he moves he feels like he is pulling bone against bone. He's tried Tylenol and ibuprofen and has helped some with the pain. He does feel like the pain is radiating up into his neck. Patient denies any numbness and tingling of arms.     Review of Systems  All other systems reviewed and are negative.       Objective:   Physical Exam  Constitutional: He is oriented to person, place, and time. He appears well-developed and well-nourished.  HENT:  Head: Normocephalic and atraumatic.  Cardiovascular: Normal rate, regular rhythm and normal heart sounds.   Pulmonary/Chest: Effort normal and breath sounds normal. He has no wheezes.  Musculoskeletal:  Normal left shoulder. Range of motion of right arm was decreased when asked to abduct his right arm to his ear. He was only able to make it a little bit above his shoulder. Range of motion in all other directions were normal. He did  feel some pain when asked to stretch his right arm across his body. Strength was 5 out of 5. Negative drop arm test. Internal and external movement was normal. Anger at is 5 out of 5. No tenderness with palpation over the spine. There was tightness and tenderness in between the spine and the scapula on the right side.  Neurological: He is alert and oriented to person, place, and time. Coordination normal.  Skin: Skin is warm and dry.  Psychiatric: He has a normal mood and affect. His behavior is normal.          Assessment & Plan:  Rhomboid strain-gave shot of Toradol 60 mg in the office. Encouraged patient to take ibuprofen up to 800 mg up to 3 times a day. 2 muscle relaxer of Norflex to use up to twice a day as needed for muscle tightness. Tramadol was given for breakthrough pain. Patient was given handout of stretches and encouraged to start doing them daily. Patient encouraged to alternate ice and heat to affected areas. Patient work for today. Followup if not improving. Reassured patient that physical exam was not supportive of any tears in the rotator cuff.  Asthma-we'll continue to monitor. Patient was sent Advair 2 pharmacy.

## 2012-08-01 ENCOUNTER — Encounter: Payer: PRIVATE HEALTH INSURANCE | Admitting: Physician Assistant

## 2012-08-17 ENCOUNTER — Encounter: Payer: PRIVATE HEALTH INSURANCE | Admitting: Family Medicine

## 2012-08-17 ENCOUNTER — Encounter: Payer: PRIVATE HEALTH INSURANCE | Admitting: Physician Assistant

## 2012-08-24 ENCOUNTER — Ambulatory Visit (INDEPENDENT_AMBULATORY_CARE_PROVIDER_SITE_OTHER): Payer: No Typology Code available for payment source | Admitting: Physician Assistant

## 2012-08-24 ENCOUNTER — Encounter: Payer: Self-pay | Admitting: Physician Assistant

## 2012-08-24 VITALS — BP 144/82 | HR 87 | Wt 171.0 lb

## 2012-08-24 DIAGNOSIS — J45909 Unspecified asthma, uncomplicated: Secondary | ICD-10-CM

## 2012-08-24 DIAGNOSIS — Z131 Encounter for screening for diabetes mellitus: Secondary | ICD-10-CM

## 2012-08-24 DIAGNOSIS — Z23 Encounter for immunization: Secondary | ICD-10-CM

## 2012-08-24 DIAGNOSIS — R599 Enlarged lymph nodes, unspecified: Secondary | ICD-10-CM

## 2012-08-24 DIAGNOSIS — Z0000 Encounter for general adult medical examination without abnormal findings: Secondary | ICD-10-CM

## 2012-08-24 DIAGNOSIS — Z13220 Encounter for screening for lipoid disorders: Secondary | ICD-10-CM

## 2012-08-24 DIAGNOSIS — Z720 Tobacco use: Secondary | ICD-10-CM

## 2012-08-24 LAB — COMPREHENSIVE METABOLIC PANEL
Alkaline Phosphatase: 54 U/L (ref 39–117)
CO2: 24 mEq/L (ref 19–32)
Creat: 0.8 mg/dL (ref 0.50–1.35)
Glucose, Bld: 87 mg/dL (ref 70–99)
Sodium: 140 mEq/L (ref 135–145)
Total Bilirubin: 0.5 mg/dL (ref 0.3–1.2)

## 2012-08-24 LAB — LIPID PANEL
HDL: 45 mg/dL (ref >39)
LDL Cholesterol: 73 mg/dL (ref 0–99)
Total CHOL/HDL Ratio: 2.9 Ratio
Triglycerides: 53 mg/dL (ref <150)

## 2012-08-24 MED ORDER — MONTELUKAST SODIUM 10 MG PO TABS: 10.0000 mg | ORAL_TABLET | Freq: Every day | ORAL | Status: DC

## 2012-08-24 MED ORDER — PREDNISONE 50 MG PO TABS: ORAL_TABLET | ORAL | Status: DC

## 2012-08-24 NOTE — Progress Notes (Signed)
Subjective:    Patient ID: Juan Mann, male    DOB: 1980-11-26, 32 y.o.   MRN: [id number]  HPI Patient presents to the clinic today for CPE.   Pt has asthma and feels like it has been flaring up in the last couple of weeks. He is on advair BID and claritin. Usually controlled on this therapy; however, for the last couple of weeks he has been having to use albuterol inhaler daily if not twice a day. He has started to cough more and becomes productive. Sputum is white/green/brown. 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 Denies any fever, chills, sweats, N/V/D. Continues to smoke but not ready to quit at this time.      Review of Systems  All other systems reviewed and are negative.       Objective:   Physical Exam BP 144/82  Pulse 87  Wt 171 lb (77.565 kg)  BMI 23.86 kg/m2  General Appearance:    Alert, cooperative, no distress, appears stated age  Head:    Normocephalic, without obvious abnormality, atraumatic  Eyes:    PERRL, conjunctiva/corneas clear, EOM's intact, fundi    benign, both eyes       Ears:    Normal TM's and external ear canals, both ears  Nose:   Nares normal, septum midline, mucosa normal, no drainage    or sinus tenderness  Throat:   Lips, mucosa, and tongue normal; teeth and gums normal  Neck:   Supple, symmetrical, trachea midline, no adenopathy;       thyroid:  Marble size lymphnode enlarged on right side of neck without tenderness; no carotid   bruit or JVD  Back:     Symmetric, no curvature, ROM normal, no CVA tenderness  Lungs:     Coarse breath sounds. Wheezing at base bilaterally. Productive cough.  Chest wall:    No tenderness or deformity  Heart:    Regular rate and rhythm, S1 and S2 normal, no murmur, rub   or gallop  Abdomen:     Soft, non-tender, bowel sounds active all four quadrants,    no masses, no organomegaly  Genitalia:    Not done pt declined.  Rectal:    Not done.   Extremities:   Extremities  normal, atraumatic, no cyanosis or edema  Pulses:   2+ and symmetric all extremities  Skin:   Skin color, texture, turgor normal, no rashes or lesions  Lymph nodes:   Cervical, supraclavicular, and axillary nodes normal  Neurologic:   CNII-XII intact. Normal strength, sensation and reflexes      throughout         Assessment & Plan:  CPE- Pneumonia vaccine was given today.Other vaccines up to date. Discussed healthy diet. Ordered fasting labs. Discussed added dairy to diet 4 servings daily.   Enlarged lymphnode- per history flares up and down. Pt made aware to watch but seems could be reactive due to allergies and drainage. If stays big or continues to increase could get a cbc and consider other testing.   Asthma exacerbation/Tobacco abuse- Not controlled currently. Added singulair to claritin. Gave 5 days of prednisone. Continue advair BID and albuterol as needed. If still having to use albuterol daily in next 2 weeks. Call office because we need to make some changes to medication. Need to consider spirometry to further evaluate lung function as some point. Will consider abx if cough production increases. I suspect due to inflammation. Discussed stopping  smoking. Pt not ready at this point. Follow up in 3 months .

## 2012-08-24 NOTE — Patient Instructions (Signed)
Added Singulair to daily regimen. Continue with claritin OTC and advair daily as well as albuterol as needed.   Will treat steroid for bronchitis.

## 2012-08-31 ENCOUNTER — Telehealth: Payer: Self-pay | Admitting: *Deleted

## 2012-08-31 MED ORDER — DOXYCYCLINE HYCLATE 100 MG PO TABS: 100.0000 mg | ORAL_TABLET | Freq: Two times a day (BID) | ORAL | Status: DC

## 2012-08-31 NOTE — Telephone Encounter (Signed)
Patient's wife notified of rx.

## 2012-08-31 NOTE — Telephone Encounter (Signed)
rx sent to pharmacy

## 2012-08-31 NOTE — Telephone Encounter (Signed)
Pt calls and states he is not feeling better and Jade told him to call and she would give an antibiotic. Been on prednisone x 5 days but still has a lot of head congestion, cough, green sputum and mucous. [facility]

## 2012-09-04 ENCOUNTER — Emergency Department (HOSPITAL_BASED_OUTPATIENT_CLINIC_OR_DEPARTMENT_OTHER)
Admission: EM | Admit: 2012-09-04 | Discharge: 2012-09-04 | Disposition: A | Discharge status: Home / Self Care | Payer: No Typology Code available for payment source | Attending: Emergency Medicine | Admitting: Emergency Medicine

## 2012-09-04 ENCOUNTER — Encounter (HOSPITAL_BASED_OUTPATIENT_CLINIC_OR_DEPARTMENT_OTHER): Payer: Self-pay | Admitting: *Deleted

## 2012-09-04 DIAGNOSIS — IMO0002 Reserved for concepts with insufficient information to code with codable children: Secondary | ICD-10-CM | POA: Insufficient documentation

## 2012-09-04 DIAGNOSIS — Y939 Activity, unspecified: Secondary | ICD-10-CM | POA: Insufficient documentation

## 2012-09-04 DIAGNOSIS — S058X9A Other injuries of unspecified eye and orbit, initial encounter: Secondary | ICD-10-CM | POA: Insufficient documentation

## 2012-09-04 DIAGNOSIS — S0501XA Injury of conjunctiva and corneal abrasion without foreign body, right eye, initial encounter: Secondary | ICD-10-CM

## 2012-09-04 DIAGNOSIS — F17200 Nicotine dependence, unspecified, uncomplicated: Secondary | ICD-10-CM | POA: Insufficient documentation

## 2012-09-04 DIAGNOSIS — Z8701 Personal history of pneumonia (recurrent): Secondary | ICD-10-CM | POA: Insufficient documentation

## 2012-09-04 DIAGNOSIS — Y929 Unspecified place or not applicable: Secondary | ICD-10-CM | POA: Insufficient documentation

## 2012-09-04 DIAGNOSIS — Z79899 Other long term (current) drug therapy: Secondary | ICD-10-CM | POA: Insufficient documentation

## 2012-09-04 DIAGNOSIS — J45909 Unspecified asthma, uncomplicated: Secondary | ICD-10-CM | POA: Insufficient documentation

## 2012-09-04 DIAGNOSIS — Y990 Civilian activity done for income or pay: Secondary | ICD-10-CM | POA: Insufficient documentation

## 2012-09-04 MED ORDER — TOBRAMYCIN 0.3 % OP SOLN: 1.0000 [drp] | OPHTHALMIC | Status: DC

## 2012-09-04 MED ORDER — FLUORESCEIN SODIUM 1 MG OP STRP
ORAL_STRIP | OPHTHALMIC | Status: AC
  Administered 2012-09-04: 1 via OPHTHALMIC
  Filled 2012-09-04: qty 2

## 2012-09-04 MED ORDER — TETRACAINE HCL 0.5 % OP SOLN
1.0000 [drp] | Freq: Once | OPHTHALMIC | Status: AC
  Administered 2012-09-04: 1 [drp] via OPHTHALMIC

## 2012-09-04 MED ORDER — TETRACAINE HCL 0.5 % OP SOLN
OPHTHALMIC | Status: AC
  Administered 2012-09-04: 1 [drp] via OPHTHALMIC
  Filled 2012-09-04: qty 2

## 2012-09-04 MED ORDER — OXYCODONE-ACETAMINOPHEN 5-325 MG PO TABS: 2.0000 | ORAL_TABLET | ORAL | Status: DC | PRN

## 2012-09-04 MED ORDER — OXYCODONE-ACETAMINOPHEN 5-325 MG PO TABS
2.0000 | ORAL_TABLET | Freq: Once | ORAL | Status: AC
  Administered 2012-09-04: 2 via ORAL
  Filled 2012-09-04 (×2): qty 2

## 2012-09-04 MED ORDER — FLUORESCEIN SODIUM 1 MG OP STRP
1.0000 | ORAL_STRIP | Freq: Once | OPHTHALMIC | Status: AC
  Administered 2012-09-04: 1 via OPHTHALMIC

## 2012-09-04 NOTE — ED Provider Notes (Signed)
History     CSN: [id number]  Arrival date & time 09/04/12  0023   First MD Initiated Contact with Patient 09/04/12 0031      Chief Complaint  Patient presents with  . Eye Pain    (Consider location/radiation/quality/duration/timing/severity/associated sxs/prior treatment) Patient is a 32 y.o. male presenting with eye pain. The history is provided by the patient. No language interpreter was used.  Eye Pain This is a new problem. The current episode started today. The problem occurs constantly. Nothing aggravates the symptoms. He has tried nothing for the symptoms.   Pt complains of foreign body sensation in both eyes.  Pt works as a Psychologist, occupational and with [employer] Past Medical History  Diagnosis Date  . Asthma   . Pneumonia     Past Surgical History  Procedure Laterality Date  . Foot surgery    . Knee surgery    . Circumcision      Family History  Problem Relation Age of Onset  . Hypertension Father   . Seizures Father   . Heart attack Paternal Grandfather   . Depression Paternal Grandfather     History  Substance Use Topics  . Smoking status: Current Every Day Smoker -- 0.50 packs/day    Types: Cigarettes  . Smokeless tobacco: Not on file  . Alcohol Use: Yes      Review of Systems  Eyes: Positive for pain.  All other systems reviewed and are negative.    Allergies  Review of patient's allergies indicates no known allergies.  Home Medications   Current Outpatient Rx  Name  Route  Sig  Dispense  Refill  . albuterol (PROVENTIL HFA;VENTOLIN HFA) 108 (90 BASE) MCG/ACT inhaler   Inhalation   Inhale 2 puffs into the lungs every 6 (six) hours as needed. Patient used this medication for shortness of breath.         . doxycycline (VIBRA-TABS) 100 MG tablet   Oral   Take 1 tablet (100 mg total) by mouth 2 (two) times daily.   20 tablet   0   . Fluticasone-Salmeterol (ADVAIR DISKUS) 250-50 MCG/DOSE AEPB   Inhalation   Inhale 1 puff into the lungs every 12  (twelve) hours.         . montelukast (SINGULAIR) 10 MG tablet   Oral   Take 1 tablet (10 mg total) by mouth at bedtime.   30 tablet   5   . oxyCODONE-acetaminophen (PERCOCET/ROXICET) 5-325 MG per tablet   Oral   Take 2 tablets by mouth every 4 (four) hours as needed.   16 tablet   0   . predniSONE (DELTASONE) 50 MG tablet      Take 1 tablet daily for 5 days.   5 tablet   0   . tobramycin (TOBREX) 0.3 % ophthalmic solution   Both Eyes   Place 1 drop into both eyes every 4 (four) hours.   5 mL   0     There were no vitals taken for this visit.  Physical Exam  Nursing note and vitals reviewed. Constitutional: He appears well-developed.  HENT:  Head: Normocephalic and atraumatic.  Eyes: EOM are normal. Pupils are equal, round, and reactive to light.  fluro no uptake,  No foreign body,  bilat eyes injected,     Neck: Normal range of motion. Neck supple.  Cardiovascular: Normal rate.   Pulmonary/Chest: Effort normal.  Neurological: He is alert.  Skin: Skin is warm.  Psychiatric: He has a normal mood  and affect.    ED Course  Procedures (including critical care time)  Labs Reviewed - No data to display No results found.   1. Corneal abrasion, bilateral, initial encounter     I swabbed bilat eyelids, I irrigated bilat eyes,     MDM  Tobrex,   Percocet.     Pt advised to follow up with Dr. Luciana Axe.      Elson Areas, PA-C 09/04/12 0111  Elson Areas, PA-C 09/04/12 [phone]

## 2012-09-04 NOTE — Discharge Instructions (Signed)
Corneal Abrasion  The cornea is the clear covering at the front and center of the eye. When looking at the colored portion (iris) of the eye, you are looking through that person's cornea.   This very thin tissue is made up of many layers. The surface layer is a single layer of cells called the corneal epithelium. This is one of the most sensitive tissues in the body. If a scratch or injury causes the corneal epithelium to come off, it is called a corneal abrasion. If the injury extends to the tissues below the epithelium, the condition is called a corneal ulcer.   CAUSES    Scratches.   Trauma.   Foreign body in the eye.   Some people have recurrences of abrasions in the area of the original injury even after they heal. This is called recurrent erosion syndrome. Recurrent erosion syndromes generally improve and go away with time.  SYMPTOMS    Eye pain.   Difficulty or inability to keep the injured eye open.   The eye becomes very sensitive to light.   Recurrent erosions tend to happen suddenly, first thing in the morning  usually upon awakening and opening the eyes.  DIAGNOSIS   Your eye professional can diagnose a corneal abrasion during an eye exam. Dye is usually placed in the eye using a drop or a small paper strip moistened by the patient's tears. When the eye is examined with a special light, the abrasion shows up clearly because of the dye.  TREATMENT    Small abrasions may be treated with antibiotic drops or ointment alone.   Usually a pressure patch is specially applied. Pressure patches prevent the eye from blinking, allowing the corneal epithelium to heal. Because blinking is less, a pressure patch also reduces the amount of pain present in the eye during healing. Most corneal abrasions heal within 2-3 days with no effect on vision. WARNING: Do not drive or operate machinery while your eye is patched. Your ability to judge distances is impaired.   If abrasion becomes infected and spreads to the  deeper tissues of the cornea, a corneal ulcer can result. This is serious because it can cause corneal scarring. Corneal scars interfere with light passing through the cornea, and cause a loss of vision in the involved eye.   If your caregiver has given you a follow-up appointment, it is very important to keep that appointment. Not keeping the appointment could result in a severe eye infection or permanent loss of vision. If there is any problem keeping the appointment, you must call back to this facility for assistance.  SEEK MEDICAL CARE IF:    You have pain, light sensitivity and a scratchy feeling in one eye (or both).   Your pressure patch keeps loosening up and you can blink your eye under the patch after treatment.   Any kind of discharge develops from the involved eye after treatment or if the lids stick together in the morning.   You have the same symptoms in the morning as you did with the original abrasion days, weeks or months after the abrasion healed.  MAKE SURE YOU:    Understand these instructions.   Will watch your condition.   Will get help right away if you are not doing well or get worse.  Document Released: 04/01/2000 Document Revised: 06/27/2011 Document Reviewed: 11/08/2007  [employer] Patient Information 2013 [employer].

## 2012-09-04 NOTE — ED Notes (Signed)
PA at bedside.

## 2012-09-04 NOTE — ED Notes (Signed)
Pt sts that he got something into his right eye today while at work. Tonight as he was getting ready for bed it became more painful and he flushed it out again. As he was flushing out his right eye, he thinks he got something into his left eye.

## 2012-09-05 NOTE — ED Provider Notes (Signed)
History/physical exam/procedure(s) were performed by non-physician practitioner and as supervising physician I was immediately available for consultation/collaboration. I have reviewed all notes and am in agreement with care and plan.   Hilario Quarry, MD 09/05/12 [phone]

## 2013-05-13 ENCOUNTER — Emergency Department (HOSPITAL_BASED_OUTPATIENT_CLINIC_OR_DEPARTMENT_OTHER)
Admission: EM | Admit: 2013-05-13 | Discharge: 2013-05-13 | Disposition: A | Discharge status: Home / Self Care | Payer: No Typology Code available for payment source | Attending: Emergency Medicine | Admitting: Emergency Medicine

## 2013-05-13 ENCOUNTER — Emergency Department (HOSPITAL_BASED_OUTPATIENT_CLINIC_OR_DEPARTMENT_OTHER): Payer: No Typology Code available for payment source

## 2013-05-13 ENCOUNTER — Encounter (HOSPITAL_BASED_OUTPATIENT_CLINIC_OR_DEPARTMENT_OTHER): Payer: Self-pay | Admitting: Emergency Medicine

## 2013-05-13 DIAGNOSIS — Z79899 Other long term (current) drug therapy: Secondary | ICD-10-CM | POA: Insufficient documentation

## 2013-05-13 DIAGNOSIS — R109 Unspecified abdominal pain: Secondary | ICD-10-CM | POA: Insufficient documentation

## 2013-05-13 DIAGNOSIS — R42 Dizziness and giddiness: Secondary | ICD-10-CM | POA: Insufficient documentation

## 2013-05-13 DIAGNOSIS — Z8701 Personal history of pneumonia (recurrent): Secondary | ICD-10-CM | POA: Insufficient documentation

## 2013-05-13 DIAGNOSIS — J45909 Unspecified asthma, uncomplicated: Secondary | ICD-10-CM | POA: Insufficient documentation

## 2013-05-13 DIAGNOSIS — F17200 Nicotine dependence, unspecified, uncomplicated: Secondary | ICD-10-CM | POA: Insufficient documentation

## 2013-05-13 DIAGNOSIS — J111 Influenza due to unidentified influenza virus with other respiratory manifestations: Secondary | ICD-10-CM | POA: Insufficient documentation

## 2013-05-13 DIAGNOSIS — R197 Diarrhea, unspecified: Secondary | ICD-10-CM | POA: Insufficient documentation

## 2013-05-13 DIAGNOSIS — IMO0002 Reserved for concepts with insufficient information to code with codable children: Secondary | ICD-10-CM | POA: Insufficient documentation

## 2013-05-13 DIAGNOSIS — R112 Nausea with vomiting, unspecified: Secondary | ICD-10-CM | POA: Insufficient documentation

## 2013-05-13 LAB — CBC WITH DIFFERENTIAL/PLATELET
BASOS ABS: 0 10*3/uL (ref 0.0–0.1)
BASOS PCT: 0 % (ref 0–1)
Eosinophils Absolute: 0 10*3/uL (ref 0.0–0.7)
Eosinophils Relative: 1 % (ref 0–5)
HCT: 46.2 % (ref 39.0–52.0)
Hemoglobin: 16.5 g/dL (ref 13.0–17.0)
LYMPHS PCT: 16 % (ref 12–46)
Lymphs Abs: 1.3 10*3/uL (ref 0.7–4.0)
MCH: 31.9 pg (ref 26.0–34.0)
MCHC: 35.7 g/dL (ref 30.0–36.0)
MCV: 89.4 fL (ref 78.0–100.0)
Monocytes Absolute: 0.7 10*3/uL (ref 0.1–1.0)
Monocytes Relative: 8 % (ref 3–12)
NEUTROS ABS: 6.3 10*3/uL (ref 1.7–7.7)
NEUTROS PCT: 76 % (ref 43–77)
PLATELETS: 211 10*3/uL (ref 150–400)
RBC: 5.17 MIL/uL (ref 4.22–5.81)
RDW: 11.8 % (ref 11.5–15.5)
WBC: 8.3 10*3/uL (ref 4.0–10.5)

## 2013-05-13 LAB — BASIC METABOLIC PANEL
BUN: 13 mg/dL (ref 6–23)
CALCIUM: 9.6 mg/dL (ref 8.4–10.5)
CHLORIDE: 101 meq/L (ref 96–112)
CO2: 26 mEq/L (ref 19–32)
CREATININE: 0.9 mg/dL (ref 0.50–1.35)
Glucose, Bld: 102 mg/dL — ABNORMAL HIGH (ref 70–99)
Potassium: 3.8 mEq/L (ref 3.7–5.3)
Sodium: 144 mEq/L (ref 137–147)

## 2013-05-13 MED ORDER — ONDANSETRON HCL 4 MG/2ML IJ SOLN: 4.0000 mg | Freq: Once | INTRAMUSCULAR | Status: DC

## 2013-05-13 MED ORDER — SODIUM CHLORIDE 0.9 % IV BOLUS (SEPSIS)
1000.0000 mL | Freq: Once | INTRAVENOUS | Status: AC
  Administered 2013-05-13: 1000 mL via INTRAVENOUS

## 2013-05-13 NOTE — Discharge Instructions (Signed)

## 2013-05-13 NOTE — ED Notes (Signed)
Pt states children had same symptoms last week. Pt complaining of fever, body aches and cough,.congestion for 5 days.

## 2013-05-13 NOTE — ED Provider Notes (Signed)
CSN: [id number]     Arrival date & time 05/13/13  0902 History   First MD Initiated Contact with Patient 05/13/13 [phone]     Chief Complaint  Patient presents with  . Fever  . Influenza   (Consider location/radiation/quality/duration/timing/severity/associated sxs/prior Treatment) HPI Comments: Patient presents with flulike symptoms. They started about 5 days ago. He's had some fevers, body aches and cough. He denies any shortness of breath. He's had some nausea with some dry heaving. He had multiple episodes of diarrhea which is improved today. He states overall his symptoms seem to be improved however he still feels very lightheaded and dizzy when he stands up. He feels very weak all over.  He's had other family members with the same symptoms.  Patient is a 33 y.o. male presenting with fever and flu symptoms.  Fever Associated symptoms: congestion, cough, diarrhea, nausea, rhinorrhea and vomiting   Associated symptoms: no chest pain, no chills, no headaches and no rash   Influenza Presenting symptoms: cough, diarrhea, fatigue, fever, nausea, rhinorrhea and vomiting   Presenting symptoms: no headaches and no shortness of breath   Associated symptoms: nasal congestion   Associated symptoms: no chills     Past Medical History  Diagnosis Date  . Asthma   . Pneumonia    Past Surgical History  Procedure Laterality Date  . Foot surgery    . Knee surgery    . Circumcision     Family History  Problem Relation Age of Onset  . Hypertension Father   . Seizures Father   . Heart attack Paternal Grandfather   . Depression Paternal Grandfather    History  Substance Use Topics  . Smoking status: Current Every Day Smoker -- 1.00 packs/day    Types: Cigarettes  . Smokeless tobacco: Not on file  . Alcohol Use: No     Comment: former drinker    Review of Systems  Constitutional: Positive for fever and fatigue. Negative for chills and diaphoresis.  HENT: Positive for congestion and  rhinorrhea. Negative for sneezing.   Eyes: Negative.   Respiratory: Positive for cough. Negative for chest tightness and shortness of breath.   Cardiovascular: Negative for chest pain and leg swelling.  Gastrointestinal: Positive for nausea, vomiting, abdominal pain and diarrhea. Negative for blood in stool.  Genitourinary: Negative for frequency, hematuria, flank pain and difficulty urinating.  Musculoskeletal: Negative for arthralgias and back pain.  Skin: Negative for rash.  Neurological: Positive for dizziness. Negative for speech difficulty, weakness, numbness and headaches.    Allergies  Review of patient's allergies indicates no known allergies.  Home Medications   Current Outpatient Rx  Name  Route  Sig  Dispense  Refill  . albuterol (PROVENTIL HFA;VENTOLIN HFA) 108 (90 BASE) MCG/ACT inhaler   Inhalation   Inhale 2 puffs into the lungs every 6 (six) hours as needed. Patient used this medication for shortness of breath.         . Fluticasone-Salmeterol (ADVAIR DISKUS) 250-50 MCG/DOSE AEPB   Inhalation   Inhale 1 puff into the lungs every 12 (twelve) hours.         . predniSONE (DELTASONE) 50 MG tablet      Take 1 tablet daily for 5 days.   5 tablet   0   . tobramycin (TOBREX) 0.3 % ophthalmic solution   Both Eyes   Place 1 drop into both eyes every 4 (four) hours.   5 mL   0    BP 103/69  Pulse 78  Temp(Src) 98 F (36.7 C) (Oral)  Resp 18  Ht 5\' 11"  (1.803 m)  Wt 175 lb (79.379 kg)  BMI 24.42 kg/m2  SpO2 100% Physical Exam  Constitutional: He is oriented to person, place, and time. He appears well-developed and well-nourished.  HENT:  Head: Normocephalic and atraumatic.  Mouth/Throat: Oropharynx is clear and moist.  Eyes: Pupils are equal, round, and reactive to light.  Neck: Normal range of motion. Neck supple.  Cardiovascular: Normal rate, regular rhythm and normal heart sounds.   Pulmonary/Chest: Effort normal and breath sounds normal. No  respiratory distress. He has no wheezes. He has no rales. He exhibits no tenderness.  Abdominal: Soft. Bowel sounds are normal. There is no tenderness. There is no rebound and no guarding.  Musculoskeletal: Normal range of motion. He exhibits no edema.  Lymphadenopathy:    He has no cervical adenopathy.  Neurological: He is alert and oriented to person, place, and time.  Skin: Skin is warm and dry. No rash noted.  Psychiatric: He has a normal mood and affect.    ED Course  Procedures (including critical care time) Labs Review Labs Reviewed  BASIC METABOLIC PANEL - Abnormal; Notable for the following:    Glucose, Bld 102 (*)    All other components within normal limits  CBC WITH DIFFERENTIAL   Imaging Review Dg Chest 2 View  05/13/2013   CLINICAL DATA:  Cough and congestion  EXAM: CHEST  2 VIEW  COMPARISON:  DG CHEST 2 VIEW dated 02/02/2011  FINDINGS: Normal mediastinum and cardiac silhouette. Normal pulmonary vasculature. No evidence of effusion, infiltrate, or pneumothorax. No acute bony abnormality.  IMPRESSION: No acute cardiopulmonary process.   Electronically Signed   By: Genevive Bi M.D.   On: 05/13/2013 10:33    EKG Interpretation   None       MDM   1. Influenza    Patient presents with a five-day history of flulike symptoms. There is no evidence of pneumonia. His vital signs are stable. He has no vomiting or ongoing diarrhea. He was discharged home with symptomatic care instructions. He was given a liter of normal saline in the ED and is feeling much better after this. He's tolerating by mouth fluids.    Rolan Bucco, MD 05/13/13 1120

## 2013-09-30 ENCOUNTER — Emergency Department (HOSPITAL_BASED_OUTPATIENT_CLINIC_OR_DEPARTMENT_OTHER)
Admission: EM | Admit: 2013-09-30 | Discharge: 2013-09-30 | Disposition: A | Discharge status: Home / Self Care | Payer: No Typology Code available for payment source | Attending: Emergency Medicine | Admitting: Emergency Medicine

## 2013-09-30 ENCOUNTER — Encounter (HOSPITAL_BASED_OUTPATIENT_CLINIC_OR_DEPARTMENT_OTHER): Payer: Self-pay | Admitting: Emergency Medicine

## 2013-09-30 DIAGNOSIS — Z79899 Other long term (current) drug therapy: Secondary | ICD-10-CM | POA: Insufficient documentation

## 2013-09-30 DIAGNOSIS — Z792 Long term (current) use of antibiotics: Secondary | ICD-10-CM | POA: Insufficient documentation

## 2013-09-30 DIAGNOSIS — J45909 Unspecified asthma, uncomplicated: Secondary | ICD-10-CM | POA: Insufficient documentation

## 2013-09-30 DIAGNOSIS — F17200 Nicotine dependence, unspecified, uncomplicated: Secondary | ICD-10-CM | POA: Insufficient documentation

## 2013-09-30 DIAGNOSIS — K029 Dental caries, unspecified: Secondary | ICD-10-CM | POA: Insufficient documentation

## 2013-09-30 DIAGNOSIS — Z8701 Personal history of pneumonia (recurrent): Secondary | ICD-10-CM | POA: Insufficient documentation

## 2013-09-30 DIAGNOSIS — IMO0002 Reserved for concepts with insufficient information to code with codable children: Secondary | ICD-10-CM | POA: Insufficient documentation

## 2013-09-30 MED ORDER — PENICILLIN V POTASSIUM 500 MG PO TABS: 500.0000 mg | ORAL_TABLET | Freq: Four times a day (QID) | ORAL | Status: AC

## 2013-09-30 MED ORDER — PENICILLIN V POTASSIUM 250 MG PO TABS
500.0000 mg | ORAL_TABLET | Freq: Once | ORAL | Status: AC
  Administered 2013-09-30: 500 mg via ORAL
  Filled 2013-09-30: qty 2

## 2013-09-30 MED ORDER — IBUPROFEN 800 MG PO TABS
800.0000 mg | ORAL_TABLET | Freq: Once | ORAL | Status: AC
  Administered 2013-09-30: 800 mg via ORAL
  Filled 2013-09-30: qty 1

## 2013-09-30 MED ORDER — HYDROCODONE-ACETAMINOPHEN 5-325 MG PO TABS: 1.0000 | ORAL_TABLET | Freq: Four times a day (QID) | ORAL | Status: DC | PRN

## 2013-09-30 NOTE — ED Notes (Signed)
Pt reports left upper dental pain x2 nights.

## 2013-09-30 NOTE — Discharge Instructions (Signed)
Dental Care and Dentist Visits Dental care supports good overall health. Regular dental visits can also help you avoid dental pain, bleeding, infection, and other more serious health problems in the future. It is important to keep the mouth healthy because diseases in the teeth, gums, and other oral tissues can spread to other areas of the body. Some problems, such as diabetes, heart disease, and pre-term labor have been associated with poor oral health.  See your dentist every 6 months. If you experience emergency problems such as a toothache or broken tooth, go to the dentist right away. If you see your dentist regularly, you may catch problems early. It is easier to be treated for problems in the early stages.  WHAT TO EXPECT AT A DENTIST VISIT  Your dentist will look for many common oral health problems and recommend proper treatment. At your regular dental visit, you can expect:  Gentle cleaning of the teeth and gums. This includes scraping and polishing. This helps to remove the sticky substance around the teeth and gums (plaque). Plaque forms in the mouth shortly after eating. Over time, plaque hardens on the teeth as tartar. If tartar is not removed regularly, it can cause problems. Cleaning also helps remove stains.  Periodic X-rays. These pictures of the teeth and supporting bone will help your dentist assess the health of your teeth.  Periodic fluoride treatments. Fluoride is a natural mineral shown to help strengthen teeth. Fluoride treatmentinvolves applying a fluoride gel or varnish to the teeth. It is most commonly done in children.  Examination of the mouth, tongue, jaws, teeth, and gums to look for any oral health problems, such as:  Cavities (dental caries). This is decay on the tooth caused by plaque, sugar, and acid in the mouth. It is best to catch a cavity when it is small.  Inflammation of the gums caused by plaque buildup (gingivitis).  Problems with the mouth or malformed  or misaligned teeth.  Oral cancer or other diseases of the soft tissues or jaws. KEEP YOUR TEETH AND GUMS HEALTHY For healthy teeth and gums, follow these general guidelines as well as your dentist's specific advice:  Have your teeth professionally cleaned at the dentist every 6 months.  Brush twice daily with a fluoride toothpaste.  Floss your teeth daily.  Ask your dentist if you need fluoride supplements, treatments, or fluoride toothpaste.  Eat a healthy diet. Reduce foods and drinks with added sugar.  Avoid smoking. TREATMENT FOR ORAL HEALTH PROBLEMS If you have oral health problems, treatment varies depending on the conditions present in your teeth and gums.  Your caregiver will most likely recommend good oral hygiene at each visit.  For cavities, gingivitis, or other oral health disease, your caregiver will perform a procedure to treat the problem. This is typically done at a separate appointment. Sometimes your caregiver will refer you to another dental specialist for specific tooth problems or for surgery. SEEK IMMEDIATE DENTAL CARE IF:  You have pain, bleeding, or soreness in the gum, tooth, jaw, or mouth area.  A permanent tooth becomes loose or separated from the gum socket.  You experience a blow or injury to the mouth or jaw area. Document Released: 12/15/2010 Document Revised: 06/27/2011 Document Reviewed: 12/15/2010 [employer] Patient Information 2014 [employer].  

## 2013-09-30 NOTE — ED Provider Notes (Signed)
CSN: [id number]     Arrival date & time 09/30/13  [id number] History   First MD Initiated Contact with Patient 09/30/13 0357     Chief Complaint  Patient presents with  . Dental Pain     (Consider location/radiation/quality/duration/timing/severity/associated sxs/prior Treatment) Patient is a 33 y.o. male presenting with tooth pain. The history is provided by the patient. No language interpreter was used.  Dental Pain Location:  Upper Upper teeth location:  15/LU 2nd molar Quality:  Dull Severity:  Severe Onset quality:  Gradual Timing:  Constant Progression:  Unchanged Chronicity:  New Context: poor dentition   Previous work-up:  Dental exam Relieved by:  Nothing Worsened by:  Nothing tried Ineffective treatments:  None tried Associated symptoms: no drooling and no fever   Risk factors: smoking   Risk factors: no alcohol problem     Past Medical History  Diagnosis Date  . Asthma   . Pneumonia    Past Surgical History  Procedure Laterality Date  . Foot surgery    . Knee surgery    . Circumcision     Family History  Problem Relation Age of Onset  . Hypertension Father   . Seizures Father   . Heart attack Paternal Grandfather   . Depression Paternal Grandfather    History  Substance Use Topics  . Smoking status: Current Every Day Smoker -- 1.00 packs/day    Types: Cigarettes  . Smokeless tobacco: Not on file  . Alcohol Use: No     Comment: former drinker    Review of Systems  Constitutional: Negative for fever.  HENT: Positive for dental problem. Negative for drooling.   All other systems reviewed and are negative.     Allergies  Review of patient's allergies indicates no known allergies.  Home Medications   Prior to Admission medications   Medication Sig Start Date End Date Taking? Authorizing Provider  albuterol (PROVENTIL HFA;VENTOLIN HFA) 108 (90 BASE) MCG/ACT inhaler Inhale 2 puffs into the lungs every 6 (six) hours as needed. Patient used this  medication for shortness of breath.   Yes Historical Provider, MD  Fluticasone-Salmeterol (ADVAIR DISKUS) 250-50 MCG/DOSE AEPB Inhale 1 puff into the lungs every 12 (twelve) hours.   Yes Historical Provider, MD  HYDROcodone-acetaminophen (NORCO) 5-325 MG per tablet Take 1 tablet by mouth every 6 (six) hours as needed. 09/30/13   Joya Willmott K Samella Lucchetti-Rasch, MD  penicillin v potassium (VEETID) 500 MG tablet Take 1 tablet (500 mg total) by mouth 4 (four) times daily. 09/30/13 10/07/13  Shariece Viveiros K Egan Sahlin-Rasch, MD  predniSONE (DELTASONE) 50 MG tablet Take 1 tablet daily for 5 days. 08/24/12   Jade L Breeback, PA-C  tobramycin (TOBREX) 0.3 % ophthalmic solution Place 1 drop into both eyes every 4 (four) hours. 09/04/12   Elson AreasLeslie K Sofia, PA-C   BP 126/84  Pulse 54  Temp(Src) 98.4 F (36.9 C) (Oral)  Resp 22  SpO2 100% Physical Exam  Constitutional: He is oriented to person, place, and time. He appears well-developed and well-nourished. No distress.  HENT:  Head: Normocephalic and atraumatic.  Mouth/Throat: Oropharynx is clear and moist. No trismus in the jaw. No oropharyngeal exudate or posterior oropharyngeal edema.    Eyes: Conjunctivae are normal. Pupils are equal, round, and reactive to light.  Neck: Normal range of motion. Neck supple. No thyromegaly present.  Cardiovascular: Normal rate, regular rhythm and intact distal pulses.   Pulmonary/Chest: Effort normal and breath sounds normal. No stridor. He has no wheezes. He has no  rales.  Abdominal: Soft. Bowel sounds are normal. There is no tenderness.  Musculoskeletal: Normal range of motion.  Lymphadenopathy:    He has no cervical adenopathy.  Neurological: He is alert and oriented to person, place, and time.  Skin: Skin is warm and dry.  Psychiatric: He has a normal mood and affect.    ED Course  Procedures (including critical care time) Labs Review Labs Reviewed - No data to display  Imaging Review No results found.   EKG  Interpretation None      MDM   Final diagnoses:  Dental caries    Follow up with a dentist for ongoing care.  Return for swelling of the face lips or tongue shortness of breath    Arien Morine K Jeff Frieden-Rasch, MD 09/30/13 [phone]

## 2015-07-14 ENCOUNTER — Emergency Department (HOSPITAL_BASED_OUTPATIENT_CLINIC_OR_DEPARTMENT_OTHER)
Admission: EM | Admit: 2015-07-14 | Discharge: 2015-07-14 | Disposition: A | Discharge status: Home / Self Care | Payer: No Typology Code available for payment source | Attending: Emergency Medicine | Admitting: Emergency Medicine

## 2015-07-14 ENCOUNTER — Emergency Department (HOSPITAL_BASED_OUTPATIENT_CLINIC_OR_DEPARTMENT_OTHER): Payer: No Typology Code available for payment source

## 2015-07-14 ENCOUNTER — Encounter (HOSPITAL_BASED_OUTPATIENT_CLINIC_OR_DEPARTMENT_OTHER): Payer: Self-pay | Admitting: *Deleted

## 2015-07-14 DIAGNOSIS — J45901 Unspecified asthma with (acute) exacerbation: Secondary | ICD-10-CM | POA: Insufficient documentation

## 2015-07-14 DIAGNOSIS — B349 Viral infection, unspecified: Secondary | ICD-10-CM

## 2015-07-14 DIAGNOSIS — J029 Acute pharyngitis, unspecified: Secondary | ICD-10-CM | POA: Insufficient documentation

## 2015-07-14 DIAGNOSIS — Z8701 Personal history of pneumonia (recurrent): Secondary | ICD-10-CM | POA: Insufficient documentation

## 2015-07-14 LAB — RAPID STREP SCREEN (MED CTR MEBANE ONLY): STREPTOCOCCUS, GROUP A SCREEN (DIRECT): NEGATIVE

## 2015-07-14 MED ORDER — IPRATROPIUM-ALBUTEROL 0.5-2.5 (3) MG/3ML IN SOLN
3.0000 mL | Freq: Four times a day (QID) | RESPIRATORY_TRACT | Status: DC
  Administered 2015-07-14: 3 mL via RESPIRATORY_TRACT
  Filled 2015-07-14: qty 3

## 2015-07-14 MED ORDER — ALBUTEROL SULFATE HFA 108 (90 BASE) MCG/ACT IN AERS
1.0000 | INHALATION_SPRAY | Freq: Once | RESPIRATORY_TRACT | Status: AC
  Administered 2015-07-14: 2 via RESPIRATORY_TRACT
  Filled 2015-07-14: qty 6.7

## 2015-07-14 MED ORDER — BENZONATATE 100 MG PO CAPS: 100.0000 mg | ORAL_CAPSULE | Freq: Three times a day (TID) | ORAL | Status: DC

## 2015-07-14 NOTE — ED Notes (Addendum)
Chills, dizziness, weak feeling, vomited 2 days ago. Sleeping a lot. Family has all been sick. Chest pressure.EKG done at triage. States he has been having unexplained wt loss for the past 6 months.

## 2015-07-14 NOTE — ED Provider Notes (Signed)
CSN: [id number]     Arrival date & time 07/14/15  1313 History   First MD Initiated Contact with Patient 07/14/15 1557     Chief Complaint  Patient presents with  . Chills    HPI   35 year old male presents today with his complaints. Patient reports that 4 days ago she had abrupt onset of fever, body aches, nausea, vomiting, sore throat and extreme fatigue. Patient reports that symptoms were more severe at initial onset, but continues to be fatigued with body aches and sore throat. Patient notes that he's been taking DayQuil and NyQuil, last dose at 9 AM this morning. Patient reports use able to return to work today, but felt too weak to continue and wanted to be further evaluated. Patient reports that he has previously been diagnosed with asthma, occasionally has wheezes, has never had any significant complications of the asthma, not currently taking any medications at home. Patient reports he smokes 1.5 packs a day. Patient currently denies any cough, chest pain, abdominal pain. Nursing note shows that patient reported a weight loss over the last 6 months, reports he has been fluctuating over the last 6 months with any eighth 10 pound range. He denies any night sweats, preceding fevers other than those of this acute illness, abnormal bruising or bleeding, or any other concerning signs or symptoms.   Past Medical History  Diagnosis Date  . Asthma   . Pneumonia    Past Surgical History  Procedure Laterality Date  . Foot surgery    . Knee surgery    . Circumcision     Family History  Problem Relation Age of Onset  . Hypertension Father   . Seizures Father   . Heart attack Paternal Grandfather   . Depression Paternal Grandfather    Social History  Substance Use Topics  . Smoking status: Current Every Day Smoker -- 1.00 packs/day    Types: Cigarettes  . Smokeless tobacco: None  . Alcohol Use: No     Comment: former drinker    Review of Systems  All other systems reviewed and are  negative.   Allergies  Review of patient's allergies indicates no known allergies.  Home Medications   Prior to Admission medications   Medication Sig Start Date End Date Taking? Authorizing Provider  albuterol (PROVENTIL HFA;VENTOLIN HFA) 108 (90 BASE) MCG/ACT inhaler Inhale 2 puffs into the lungs every 6 (six) hours as needed. Patient used this medication for shortness of breath.    Historical Provider, MD  benzonatate (TESSALON) 100 MG capsule Take 1 capsule (100 mg total) by mouth every 8 (eight) hours. 07/14/15   Eyvonne Mechanic, PA-C  Fluticasone-Salmeterol (ADVAIR DISKUS) 250-50 MCG/DOSE AEPB Inhale 1 puff into the lungs every 12 (twelve) hours.    Historical Provider, MD  HYDROcodone-acetaminophen (NORCO) 5-325 MG per tablet Take 1 tablet by mouth every 6 (six) hours as needed. 09/30/13   April Palumbo, MD  predniSONE (DELTASONE) 50 MG tablet Take 1 tablet daily for 5 days. 08/24/12   Jade L Breeback, PA-C  tobramycin (TOBREX) 0.3 % ophthalmic solution Place 1 drop into both eyes every 4 (four) hours. 09/04/12   Elson Areas, PA-C   BP 129/93 mmHg  Pulse 61  Temp(Src) 98.6 F (37 C) (Oral)  Resp 18  Ht  (1.803 m)  Wt 79.379 kg  BMI 24.42 kg/m2  SpO2 100% Physical Exam  Constitutional: He is oriented to person, place, and time. He appears well-developed and well-nourished. No distress.  HENT:  Head: Normocephalic and atraumatic.  Right Ear: Hearing and tympanic membrane normal.  Left Ear: Hearing and tympanic membrane normal.  Eyes: Conjunctivae are normal. Pupils are equal, round, and reactive to light. Right eye exhibits no discharge. Left eye exhibits no discharge. No scleral icterus.  Neck: Normal range of motion. No JVD present. No tracheal deviation present.  Cardiovascular: Normal rate, regular rhythm, normal heart sounds and intact distal pulses.  Exam reveals no gallop and no friction rub.   No murmur heard. Pulmonary/Chest: Effort normal. No stridor. No  respiratory distress. He has wheezes. He has no rales. He exhibits no tenderness.  Abdominal: Soft. He exhibits no distension and no mass. There is no tenderness. There is no rebound and no guarding.  Musculoskeletal: Normal range of motion. He exhibits no edema.  Neurological: He is alert and oriented to person, place, and time. Coordination normal.  Skin: Skin is warm and dry. No rash noted. No erythema. No pallor.  Psychiatric: He has a normal mood and affect. His behavior is normal. Judgment and thought content normal.  Nursing note and vitals reviewed.   ED Course  Procedures (including critical care time) Labs Review Labs Reviewed  RAPID STREP SCREEN (NOT AT [facility])  CULTURE, GROUP A STREP [facility])    Imaging Review Dg Chest 2 View  07/14/2015  CLINICAL DATA:  Wheezing, fever and chest pain for 3 days. EXAM: CHEST  2 VIEW COMPARISON:  05/13/2013. FINDINGS: The cardiac silhouette, mediastinal and hilar contours are normal and stable. Mild gynecomastia. The lungs are clear. No pleural effusion. The bony thorax is normal. IMPRESSION: No acute cardiopulmonary findings. Electronically Signed   By: Rudie MeyerP.  Gallerani M.D.   On: 07/14/2015 15:58   I have personally reviewed and evaluated these images and lab results as part of my medical decision-making.   EKG Interpretation   Date/Time:  Tuesday July 14 2015 13:28:30 EDT Ventricular Rate:  77 PR Interval:  114 QRS Duration: 84 QT Interval:  364 QTC Calculation: 411 R Axis:   75 Text Interpretation:  Normal sinus rhythm Normal ECG Confirmed by DELO   MD, DOUGLAS ([id number]) on 07/14/2015 3:03:49 PM      MDM   Final diagnoses:  Viral infection    Labs: Rapid strep  Imaging: DG chest  Consults:  Therapeutics: DuoNeb  Discharge Meds: Albuterol, Tessalon Perles  Assessment/Plan: 35 year old male presents today with likely viral URI. Patient has no objective findings of infection. Patient does have wheeze on exam, he denies any  shortness of breath. Chest x-ray normal, oxygen 100% respirations unlabored at 18, he is afebrile in no acute distress. Patient given a breathing treatment here which significantly improved his symptoms, he'll be discharged home with above medications, symptomatic care instructions, return precautions. Patient is encouraged follow-up with primary care for reevaluation further management of ongoing complaints.        Eyvonne MechanicJeffrey Gari Trovato, PA-C 07/14/15 1736  Pricilla LovelessScott Goldston, MD 07/16/15 1450

## 2015-07-14 NOTE — Discharge Instructions (Signed)
Viral Infections °A viral infection can be caused by different types of viruses. Most viral infections are not serious and resolve on their own. However, some infections may cause severe symptoms and may lead to further complications. °SYMPTOMS °Viruses can frequently cause: °· Minor sore throat. °· Aches and pains. °· Headaches. °· Runny nose. °· Different types of rashes. °· Watery eyes. °· Tiredness. °· Cough. °· Loss of appetite. °· Gastrointestinal infections, resulting in nausea, vomiting, and diarrhea. °These symptoms do not respond to antibiotics because the infection is not caused by bacteria. However, you might catch a bacterial infection following the viral infection. This is sometimes called a "superinfection." Symptoms of such a bacterial infection may include: °· Worsening sore throat with pus and difficulty swallowing. °· Swollen neck glands. °· Chills and a high or persistent fever. °· Severe headache. °· Tenderness over the sinuses. °· Persistent overall ill feeling (malaise), muscle aches, and tiredness (fatigue). °· Persistent cough. °· Yellow, green, or brown mucus production with coughing. °HOME CARE INSTRUCTIONS  °· Only take over-the-counter or prescription medicines for pain, discomfort, diarrhea, or fever as directed by your caregiver. °· Drink enough water and fluids to keep your urine clear or pale yellow. Sports drinks can provide valuable electrolytes, sugars, and hydration. °· Get plenty of rest and maintain proper nutrition. Soups and broths with crackers or rice are fine. °SEEK IMMEDIATE MEDICAL CARE IF:  °· You have severe headaches, shortness of breath, chest pain, neck pain, or an unusual rash. °· You have uncontrolled vomiting, diarrhea, or you are unable to keep down fluids. °· You or your child has an oral temperature above 102° F (38.9° C), not controlled by medicine. °· Your baby is older than 3 months with a rectal temperature of 102° F (38.9° C) or higher. °· Your baby is 3  months old or younger with a rectal temperature of 100.4° F (38° C) or higher. °MAKE SURE YOU:  °· Understand these instructions. °· Will watch your condition. °· Will get help right away if you are not doing well or get worse. °  °This information is not intended to replace advice given to you by your health care provider. Make sure you discuss any questions you have with your health care provider. °  °Document Released: 01/12/2005 Document Revised: 06/27/2011 Document Reviewed: 09/10/2014 °Elsevier Interactive Patient Education ©2016 [employer] ° °

## 2015-07-17 LAB — CULTURE, GROUP A STREP (THRC)

## 2017-11-19 ENCOUNTER — Other Ambulatory Visit: Payer: Self-pay

## 2017-11-19 ENCOUNTER — Emergency Department (HOSPITAL_BASED_OUTPATIENT_CLINIC_OR_DEPARTMENT_OTHER)
Admission: EM | Admit: 2017-11-19 | Discharge: 2017-11-19 | Disposition: A | Discharge status: Home / Self Care | Payer: BLUE CROSS/BLUE SHIELD | Attending: Emergency Medicine | Admitting: Emergency Medicine

## 2017-11-19 ENCOUNTER — Encounter (HOSPITAL_BASED_OUTPATIENT_CLINIC_OR_DEPARTMENT_OTHER): Payer: Self-pay | Admitting: Emergency Medicine

## 2017-11-19 ENCOUNTER — Emergency Department (HOSPITAL_BASED_OUTPATIENT_CLINIC_OR_DEPARTMENT_OTHER): Payer: BLUE CROSS/BLUE SHIELD

## 2017-11-19 DIAGNOSIS — Y929 Unspecified place or not applicable: Secondary | ICD-10-CM | POA: Diagnosis not present

## 2017-11-19 DIAGNOSIS — S20212A Contusion of left front wall of thorax, initial encounter: Secondary | ICD-10-CM | POA: Insufficient documentation

## 2017-11-19 DIAGNOSIS — Y999 Unspecified external cause status: Secondary | ICD-10-CM | POA: Insufficient documentation

## 2017-11-19 DIAGNOSIS — S29001A Unspecified injury of muscle and tendon of front wall of thorax, initial encounter: Secondary | ICD-10-CM | POA: Diagnosis present

## 2017-11-19 DIAGNOSIS — W51XXXA Accidental striking against or bumped into by another person, initial encounter: Secondary | ICD-10-CM | POA: Diagnosis not present

## 2017-11-19 DIAGNOSIS — R0789 Other chest pain: Secondary | ICD-10-CM

## 2017-11-19 DIAGNOSIS — J45909 Unspecified asthma, uncomplicated: Secondary | ICD-10-CM | POA: Insufficient documentation

## 2017-11-19 DIAGNOSIS — M7918 Myalgia, other site: Secondary | ICD-10-CM

## 2017-11-19 DIAGNOSIS — F17210 Nicotine dependence, cigarettes, uncomplicated: Secondary | ICD-10-CM | POA: Insufficient documentation

## 2017-11-19 DIAGNOSIS — Y939 Activity, unspecified: Secondary | ICD-10-CM | POA: Insufficient documentation

## 2017-11-19 LAB — BASIC METABOLIC PANEL
ANION GAP: 9 (ref 5–15)
BUN: 11 mg/dL (ref 6–20)
CALCIUM: 9.1 mg/dL (ref 8.9–10.3)
CO2: 24 mmol/L (ref 22–32)
CREATININE: 0.67 mg/dL (ref 0.61–1.24)
Chloride: 108 mmol/L (ref 98–111)
Glucose, Bld: 90 mg/dL (ref 70–99)
Potassium: 3.7 mmol/L (ref 3.5–5.1)
SODIUM: 141 mmol/L (ref 135–145)

## 2017-11-19 LAB — CBC WITH DIFFERENTIAL/PLATELET
BASOS ABS: 0 10*3/uL (ref 0.0–0.1)
BASOS PCT: 0 %
EOS ABS: 0.2 10*3/uL (ref 0.0–0.7)
Eosinophils Relative: 2 %
HEMATOCRIT: 43 % (ref 39.0–52.0)
HEMOGLOBIN: 15.6 g/dL (ref 13.0–17.0)
Lymphocytes Relative: 15 %
Lymphs Abs: 1.4 10*3/uL (ref 0.7–4.0)
MCH: 33.1 pg (ref 26.0–34.0)
MCHC: 36.3 g/dL — AB (ref 30.0–36.0)
MCV: 91.1 fL (ref 78.0–100.0)
Monocytes Absolute: 0.7 10*3/uL (ref 0.1–1.0)
Monocytes Relative: 8 %
NEUTROS ABS: 6.9 10*3/uL (ref 1.7–7.7)
NEUTROS PCT: 75 %
Platelets: 210 10*3/uL (ref 150–400)
RBC: 4.72 MIL/uL (ref 4.22–5.81)
RDW: 12 % (ref 11.5–15.5)
WBC: 9.1 10*3/uL (ref 4.0–10.5)

## 2017-11-19 LAB — D-DIMER, QUANTITATIVE (NOT AT ARMC)

## 2017-11-19 LAB — TROPONIN I

## 2017-11-19 MED ORDER — MORPHINE SULFATE (PF) 4 MG/ML IV SOLN
4.0000 mg | Freq: Once | INTRAVENOUS | Status: AC
  Administered 2017-11-19: 4 mg via INTRAVENOUS
  Filled 2017-11-19: qty 1

## 2017-11-19 MED ORDER — METHOCARBAMOL 500 MG PO TABS: 500.0000 mg | ORAL_TABLET | Freq: Two times a day (BID) | ORAL | 0 refills | Status: DC

## 2017-11-19 MED ORDER — ALBUTEROL SULFATE (2.5 MG/3ML) 0.083% IN NEBU
2.5000 mg | INHALATION_SOLUTION | Freq: Once | RESPIRATORY_TRACT | Status: AC
  Administered 2017-11-19: 2.5 mg via RESPIRATORY_TRACT
  Filled 2017-11-19: qty 3

## 2017-11-19 MED ORDER — METHOCARBAMOL 500 MG PO TABS: 500.0000 mg | ORAL_TABLET | Freq: Two times a day (BID) | ORAL | 0 refills | Status: AC

## 2017-11-19 MED ORDER — KETOROLAC TROMETHAMINE 30 MG/ML IJ SOLN
30.0000 mg | Freq: Once | INTRAMUSCULAR | Status: AC
  Administered 2017-11-19: 30 mg via INTRAVENOUS
  Filled 2017-11-19: qty 1
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
## 2017-11-19 NOTE — Discharge Instructions (Signed)
You can take Tylenol or Ibuprofen as directed for pain. You can alternate Tylenol and Ibuprofen every 4 hours. If you take Tylenol at 1pm, then you can take Ibuprofen at 5pm. Then you can take Tylenol again at 9pm.   Take Robaxin as prescribed. This medication will make you drowsy so do not drive or drink alcohol when taking it.  [facility].  Return to emergency department for any worsening pain, difficulty breathing, fever, any other worsening or concerning symptoms.

## 2017-11-22 ENCOUNTER — Encounter (HOSPITAL_BASED_OUTPATIENT_CLINIC_OR_DEPARTMENT_OTHER): Payer: Self-pay

## 2017-11-22 ENCOUNTER — Emergency Department (HOSPITAL_BASED_OUTPATIENT_CLINIC_OR_DEPARTMENT_OTHER)
Admission: EM | Admit: 2017-11-22 | Discharge: 2017-11-22 | Disposition: A | Discharge status: Home / Self Care | Payer: BLUE CROSS/BLUE SHIELD | Attending: Emergency Medicine | Admitting: Emergency Medicine

## 2017-11-22 ENCOUNTER — Other Ambulatory Visit: Payer: Self-pay

## 2017-11-22 DIAGNOSIS — R079 Chest pain, unspecified: Secondary | ICD-10-CM | POA: Diagnosis present

## 2017-11-22 DIAGNOSIS — R091 Pleurisy: Secondary | ICD-10-CM | POA: Diagnosis not present

## 2017-11-22 DIAGNOSIS — J45909 Unspecified asthma, uncomplicated: Secondary | ICD-10-CM | POA: Insufficient documentation

## 2017-11-22 DIAGNOSIS — F17210 Nicotine dependence, cigarettes, uncomplicated: Secondary | ICD-10-CM | POA: Insufficient documentation

## 2017-11-22 DIAGNOSIS — Z79899 Other long term (current) drug therapy: Secondary | ICD-10-CM | POA: Diagnosis not present

## 2017-11-22 MED ORDER — DEXAMETHASONE 4 MG PO TABS: 4.0000 mg | ORAL_TABLET | Freq: Two times a day (BID) | ORAL | 0 refills | Status: DC

## 2017-11-22 MED ORDER — TRAMADOL HCL 50 MG PO TABS: 50.0000 mg | ORAL_TABLET | Freq: Four times a day (QID) | ORAL | 0 refills | Status: DC | PRN

## 2017-11-22 MED FILL — DEXAMETHASONE 4 MG TABLET: 4 | 5 days supply | Qty: 10 | Fill #0

## 2017-11-22 MED FILL — traMADol HCL 50 MG TABS: 50 | 2 days supply | Qty: 10 | Fill #0
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
## 2017-11-22 NOTE — Discharge Instructions (Addendum)
Keep taking ibuprofen 600 mg every 6 hours as needed.

## 2017-11-26 NOTE — ED Provider Notes (Signed)
[facility] EMERGENCY DEPARTMENT Provider Note   CSN: [id number] Arrival date & time: 11/22/17  1144     History   Chief Complaint Chief Complaint  Patient presents with  . Chest Pain    HPI Juan Mann is a 37 y.o. male.  HPI   37 year old male with chest pain.  Pain is in the left side of his chest anteriorly and into his left shoulder.  Worse with movement, deep breathing and coughing.  He was seen in the emergency room a couple days ago for the same.  He is concerned because his symptoms have persisted.  They have not changed significantly in quality since he was last seen.  No fevers or chills.  Occasional cough which is nonproductive.  He does not feel short of breath.  No unusual leg pain or swelling.  Past Medical History:  Diagnosis Date  . Asthma   . Pneumonia     Patient Active Problem List   Diagnosis Date Noted  . Asthma 07/25/2012    Past Surgical History:  Procedure Laterality Date  . CIRCUMCISION    . FOOT SURGERY    . KNEE SURGERY          Home Medications    Prior to Admission medications   Medication Sig Start Date End Date Taking? Authorizing Provider  albuterol (PROVENTIL HFA;VENTOLIN HFA) 108 (90 BASE) MCG/ACT inhaler Inhale 2 puffs into the lungs every 6 (six) hours as needed. Patient used this medication for shortness of breath.    [provider]  benzonatate (TESSALON) 100 MG capsule Take 1 capsule (100 mg total) by mouth every 8 (eight) hours. 07/14/15   Hedges, Tinnie Gens, PA-C  dexamethasone (DECADRON) 4 MG tablet Take 1 tablet (4 mg total) by mouth 2 (two) times daily. 11/22/17   Raeford Razor, MD  Fluticasone-Salmeterol (ADVAIR DISKUS) 250-50 MCG/DOSE AEPB Inhale 1 puff into the lungs every 12 (twelve) hours.    [provider]  HYDROcodone-acetaminophen (NORCO) 5-325 MG per tablet Take 1 tablet by mouth every 6 (six) hours as needed. 09/30/13   Palumbo, April, MD  methocarbamol (ROBAXIN) 500 MG tablet Take 1  tablet (500 mg total) by mouth 2 (two) times daily for 7 days. 11/19/17 11/26/17  Maxwell Caul, PA-C  tobramycin (TOBREX) 0.3 % ophthalmic solution Place 1 drop into both eyes every 4 (four) hours. 09/04/12   Elson Areas, PA-C  traMADol (ULTRAM) 50 MG tablet Take 1 tablet (50 mg total) by mouth every 6 (six) hours as needed. 11/22/17   Raeford Razor, MD    Family History Family History  Problem Relation Age of Onset  . Hypertension Father   . Seizures Father   . Heart attack Paternal Grandfather   . Depression Paternal Grandfather     Social History Social History   Tobacco Use  . Smoking status: Current Every Day Smoker    Packs/day: 1.00    Types: Cigarettes  . Smokeless tobacco: Never Used  Substance Use Topics  . Alcohol use: Yes    Comment: rare  . Drug use: Yes    Types: Marijuana     Allergies   Patient has no known allergies.   Review of Systems Review of Systems  All systems reviewed and negative, other than as noted in HPI.  Physical Exam Updated Vital Signs BP 127/75 (BP Location: Right Arm)   Pulse 68   Temp 98.4 F (36.9 C) (Oral)   Resp 18   Ht 5\' 11"  (  1.803 m)   Wt 75.3 kg   SpO2 100%   BMI 23.15 kg/m   Physical Exam  Constitutional: He appears well-developed and well-nourished. No distress.  HENT:  Head: Normocephalic and atraumatic.  Eyes: Conjunctivae are normal. Right eye exhibits no discharge. Left eye exhibits no discharge.  Neck: Neck supple.  Cardiovascular: Normal rate, regular rhythm and normal heart sounds. Exam reveals no gallop and no friction rub.  No murmur heard. Pulmonary/Chest: Effort normal and breath sounds normal. No respiratory distress. He exhibits tenderness.  Tenderness along the left anterior chest extending up to the left anterior shoulder.  Pain is reproducible with range of motion of the shoulder.  No overlying skin changes.  Abdominal: Soft. He exhibits no distension. There is no tenderness.    Musculoskeletal: He exhibits no edema or tenderness.  Neurological: He is alert.  Skin: Skin is warm and dry.  Psychiatric: He has a normal mood and affect. His behavior is normal. Thought content normal.  Nursing note and vitals reviewed.    ED Treatments / Results  Labs (all labs ordered are listed, but only abnormal results are displayed) Labs Reviewed - No data to display  EKG EKG Interpretation  Date/Time:  Wednesday November 22 2017 11:57:06 EDT Ventricular Rate:  76 PR Interval:  126 QRS Duration: 86 QT Interval:  358 QTC Calculation: 402 R Axis:   69 Text Interpretation:  Normal sinus rhythm with sinus arrhythmia Normal ECG No significant change was found Confirmed by Paula LibraMolpus, John ([id number]) on 11/23/2017 12:51:33 PM   Radiology No results found.  Procedures Procedures (including critical care time)  Medications Ordered in ED Medications - No data to display   Initial Impression / Assessment and Plan / ED Course  I have reviewed the triage vital signs and the nursing notes.  Pertinent labs & imaging results that were available during my care of the patient were reviewed by me and considered in my medical decision making (see chart for details).     37 year old male with chest pain.  Sounds pleuritic in nature.  Also very reproducible with palpation and certain movements.  He was worked up for the same few days ago.  Symptoms not sniffily change.  Is afebrile.  No increased work of breathing.  O2 sats were normal.  I doubt PE or other emergent process driving his symptoms.  Plan continue symptomatic treatment, particularly NSAIDs.  Emergent return precautions were discussed.  Outpatient follow-up otherwise.  Final Clinical Impressions(s) / ED Diagnoses   Final diagnoses:  Pleurisy    ED Discharge Orders         Ordered    traMADol (ULTRAM) 50 MG tablet  Every 6 hours PRN     11/22/17 1241    dexamethasone (DECADRON) 4 MG tablet  2 times daily     11/22/17  1241           Raeford RazorKohut, Ryen Rhames, MD 11/26/17 [phone]

## 2017-11-29 ENCOUNTER — Emergency Department (HOSPITAL_BASED_OUTPATIENT_CLINIC_OR_DEPARTMENT_OTHER)
Admission: EM | Admit: 2017-11-29 | Discharge: 2017-11-29 | Disposition: A | Discharge status: Home / Self Care | Payer: BLUE CROSS/BLUE SHIELD | Attending: Emergency Medicine | Admitting: Emergency Medicine

## 2017-11-29 ENCOUNTER — Encounter (HOSPITAL_BASED_OUTPATIENT_CLINIC_OR_DEPARTMENT_OTHER): Payer: Self-pay

## 2017-11-29 ENCOUNTER — Emergency Department (HOSPITAL_BASED_OUTPATIENT_CLINIC_OR_DEPARTMENT_OTHER): Payer: BLUE CROSS/BLUE SHIELD

## 2017-11-29 ENCOUNTER — Other Ambulatory Visit: Payer: Self-pay

## 2017-11-29 DIAGNOSIS — Z79899 Other long term (current) drug therapy: Secondary | ICD-10-CM | POA: Insufficient documentation

## 2017-11-29 DIAGNOSIS — R0789 Other chest pain: Secondary | ICD-10-CM | POA: Diagnosis not present

## 2017-11-29 DIAGNOSIS — J45909 Unspecified asthma, uncomplicated: Secondary | ICD-10-CM | POA: Insufficient documentation

## 2017-11-29 DIAGNOSIS — F17210 Nicotine dependence, cigarettes, uncomplicated: Secondary | ICD-10-CM | POA: Insufficient documentation

## 2017-11-29 DIAGNOSIS — R079 Chest pain, unspecified: Secondary | ICD-10-CM | POA: Diagnosis present

## 2017-11-29 LAB — BASIC METABOLIC PANEL
ANION GAP: 8 (ref 5–15)
BUN: 18 mg/dL (ref 6–20)
CHLORIDE: 107 mmol/L (ref 98–111)
CO2: 28 mmol/L (ref 22–32)
CREATININE: 0.77 mg/dL (ref 0.61–1.24)
Calcium: 8.6 mg/dL — ABNORMAL LOW (ref 8.9–10.3)
GFR calc non Af Amer: >60 mL/min (ref >60)
Glucose, Bld: 100 mg/dL — ABNORMAL HIGH (ref 70–99)
POTASSIUM: 3.7 mmol/L (ref 3.5–5.1)
Sodium: 143 mmol/L (ref 135–145)

## 2017-11-29 LAB — CBC
HEMATOCRIT: 42.8 % (ref 39.0–52.0)
HEMOGLOBIN: 15.1 g/dL (ref 13.0–17.0)
MCH: 32.7 pg (ref 26.0–34.0)
MCHC: 35.3 g/dL (ref 30.0–36.0)
MCV: 92.6 fL (ref 78.0–100.0)
Platelets: 229 10*3/uL (ref 150–400)
RBC: 4.62 MIL/uL (ref 4.22–5.81)
RDW: 12.3 % (ref 11.5–15.5)
WBC: 8.1 10*3/uL (ref 4.0–10.5)

## 2017-11-29 LAB — TROPONIN I: Troponin I: <0.03 ng/mL (ref <0.03)

## 2017-11-29 NOTE — ED Notes (Signed)
Patient walking down hallway.  Chased after patient, patient removed own IV.  Asked patient if he was leaving AMA.  Patient states "yeah, i'm leaving because yall don't know jack shit".

## 2017-11-29 NOTE — Discharge Instructions (Signed)
Your work-up today was normal and did not show any emergent heart or lung related issues.   Your chest pain is most likely due to a muscle strain. Muscles take at least 4-6 weeks until they are 100% back to normal. You may use Tylenol, Ibuprofen and/or ice/heat on your chest for relief. Also, take it easy at work as putting these muscles under strain will delay the healing process.  It is very important that you establish care with a PCP. I have listed information below on a PCP that is in this building. They are the appropriate people to follow-up with.  Return to the emergency department for the following: worsening chest pain (that is not affected by movement or touch), worsening shortness of breath

## 2017-11-29 NOTE — ED Provider Notes (Signed)
[facility] EMERGENCY DEPARTMENT Provider Note  CSN: [id number] Arrival date & time: 11/29/17  1654  History   Chief Complaint Chief Complaint  Patient presents with  . Chest Pain    HPI Juan Mann is a 37 y.o. male with a medical history of asthma who presented to the ED for chest pain x11 days. He describes left sided sharp pain that begins midsternal and extends to his shoulder and underneath the left arm. Pain is worse with deep breaths and movements. Denies fever, SOB, palpitations, leg swelling, diaphoresis, abdominal pain or N/V. Patient states that he has been to the ED for 2 other times for this. He states that the pain has not worsened since the other rests, but came back to the ED because he was told to return if symptoms did not improve.  Additional history obtained by medical chart. ED visits on 11/19/17 and 11/22/17 for same complaint. He has been treated for pleurisy and MSK chest pain.  Past Medical History:  Diagnosis Date  . Asthma   . Pneumonia     Patient Active Problem List   Diagnosis Date Noted  . Asthma 07/25/2012    Past Surgical History:  Procedure Laterality Date  . CIRCUMCISION    . FOOT SURGERY    . KNEE SURGERY          Home Medications    Prior to Admission medications   Medication Sig Start Date End Date Taking? Authorizing Provider  albuterol (PROVENTIL HFA;VENTOLIN HFA) 108 (90 BASE) MCG/ACT inhaler Inhale 2 puffs into the lungs every 6 (six) hours as needed. Patient used this medication for shortness of breath.    [provider]  benzonatate (TESSALON) 100 MG capsule Take 1 capsule (100 mg total) by mouth every 8 (eight) hours. 07/14/15   Hedges, Tinnie GensJeffrey, PA-C  dexamethasone (DECADRON) 4 MG tablet Take 1 tablet (4 mg total) by mouth 2 (two) times daily. 11/22/17   Raeford RazorKohut, Stephen, MD  Fluticasone-Salmeterol (ADVAIR DISKUS) 250-50 MCG/DOSE AEPB Inhale 1 puff into the lungs every 12 (twelve) hours.    [provider]  HYDROcodone-acetaminophen (NORCO) 5-325 MG per tablet Take 1 tablet by mouth every 6 (six) hours as needed. 09/30/13   Palumbo, April, MD  tobramycin (TOBREX) 0.3 % ophthalmic solution Place 1 drop into both eyes every 4 (four) hours. 09/04/12   Elson AreasSofia, Leslie K, PA-C  traMADol (ULTRAM) 50 MG tablet Take 1 tablet (50 mg total) by mouth every 6 (six) hours as needed. 11/22/17   Raeford RazorKohut, Stephen, MD    Family History Family History  Problem Relation Age of Onset  . Hypertension Father   . Seizures Father   . Heart attack Paternal Grandfather   . Depression Paternal Grandfather     Social History Social History   Tobacco Use  . Smoking status: Current Every Day Smoker    Packs/day: 1.00    Types: Cigarettes  . Smokeless tobacco: Never Used  Substance Use Topics  . Alcohol use: Yes    Comment: rare  . Drug use: Yes    Types: Marijuana     Allergies   Patient has no known allergies.   Review of Systems Review of Systems  Constitutional: Negative for chills and fever.  Respiratory: Negative for cough and shortness of breath.        Pleuritic chest pain  Cardiovascular: Positive for chest pain. Negative for palpitations and leg swelling.  Gastrointestinal: Negative for abdominal pain, nausea and vomiting.  Musculoskeletal:  Negative.   Skin: Negative.   Neurological: Negative.      Physical Exam Updated Vital Signs BP 136/85 (BP Location: Left Arm)   Pulse 72   Temp 98.4 F (36.9 C) (Oral)   Resp 18   Ht 5\' 11"  (1.803 m)   Wt 76.2 kg   SpO2 100%   BMI 23.43 kg/m   Physical Exam  Constitutional: Vital signs are normal. He appears well-developed and well-nourished.  Appears older than stated age  Neck: Normal range of motion and full passive range of motion without pain. Neck supple. Normal carotid pulses present. Carotid bruit is not present.  Cardiovascular: Normal rate, regular rhythm, normal heart sounds, intact distal pulses and normal pulses. Exam reveals no  friction rub.  No murmur heard. Pulmonary/Chest: Effort normal. No accessory muscle usage. No tachypnea. No respiratory distress. He has rhonchi.  Rhonchi heard throughout lung fields bilaterally.  Skin: Skin is warm. Capillary refill takes less than 2 seconds. He is not diaphoretic. No pallor.  Nursing note and vitals reviewed.  ED Treatments / Results  Labs (all labs ordered are listed, but only abnormal results are displayed) Labs Reviewed  BASIC METABOLIC PANEL - Abnormal; Notable for the following components:      Result Value   Glucose, Bld 100 (*)    Calcium 8.6 (*)    All other components within normal limits  CBC  TROPONIN I    EKG EKG Interpretation  Date/Time:  Wednesday November 29 2017 17:31:30 EDT Ventricular Rate:  68 PR Interval:    QRS Duration: 90 QT Interval:  397 QTC Calculation: 423 R Axis:   79 Text Interpretation:  Sinus arrhythmia Borderline short PR interval similar to prior 8/19 Confirmed by Meridee Score [phone]) on 11/29/2017 5:46:08 PM   Radiology Dg Chest 2 View  Result Date: 11/29/2017 CLINICAL DATA:  Chest pain EXAM: CHEST - 2 VIEW COMPARISON:  11/19/2017, 07/14/2015 FINDINGS: No acute airspace disease or effusion. Normal heart size. No pneumothorax. IMPRESSION: No active cardiopulmonary disease. Electronically Signed   By: Jasmine Pang M.D.   On: 11/29/2017 17:58    Procedures Procedures (including critical care time)  Medications Ordered in ED Medications - No data to display   Initial Impression / Assessment and Plan / ED Course  Triage vital signs and the nursing notes have been reviewed.  Pertinent labs & imaging results that were available during care of the patient were reviewed and considered in medical decision making (see chart for details).  Patient presents for reproducible chest wall pain that has been ongoing for 11 days. This is his 3rd ED visit since 11/19/17 for this. Cardiac evaluations at that time have not resulted in  any acute cardiac or pulmonary events. History and physical exam is consistent with a MSK etiology to his pain given that is reproducible and worse with movements. Rhonchis heard on pulmonary exam, but patient is not in respiratory distress and is able to maintain oxygen sats > 98%. He is chronic smoker.  Clinical Course as of Nov 29 1849  Wed Nov 29, 2017  1828 EKG showed NSR. No ST elevations/depressions or signs of acute ischemia or infarct. This is reassuring in combination with negative troponin which assists in evaluating and ruling out an acute cardiac process. CXR normal. No acute intra/extra-pleural abnormalities. No consolidation or vascular congestion. Consistent findings with last CXR on 11/19/17. Remaining labs also normal.   [GM]    Clinical Course User Index [GM] Mortis, Sharyon Medicus, PA-C  Cardiac and pulmonary work-up today is unremarkable and consistent with findings from last 2 ED visits. Chest pain is MSK in nature. This provider spent significant time with patient discussing this and appropriate return precautions. Advised establishment with a PCP.  Final Clinical Impressions(s) / ED Diagnoses  1. Chest Wall Pain. MSK strain. Education on OTC and supportive treatment for pain relief. Education provided on s/s that would warrant return to ED vs urgent care vs PCP.   Dispo: Home. After thorough clinical evaluation, this patient is determined to be medically stable and can be safely discharged with the previously mentioned treatment and/or outpatient follow-up/referral(s). At this time, there are no other apparent medical conditions that require further screening, evaluation or treatment.   Final diagnoses:  Chest wall pain    ED Discharge Orders    None        Reva BoresMortis, Gabrielle I, PA-C 11/29/17 1851    Terrilee FilesButler, Michael C, MD 12/01/17 1346

## 2017-11-29 NOTE — ED Triage Notes (Signed)
C/o left side CP-third visit for same c/o states-states pain is worse with movement-he states he is out of pain med-NAD-steady gait

## 2018-07-05 ENCOUNTER — Ambulatory Visit: Payer: Self-pay | Admitting: Family Medicine

## 2019-02-01 ENCOUNTER — Encounter (HOSPITAL_BASED_OUTPATIENT_CLINIC_OR_DEPARTMENT_OTHER): Payer: Self-pay | Admitting: *Deleted

## 2019-02-01 ENCOUNTER — Emergency Department (HOSPITAL_BASED_OUTPATIENT_CLINIC_OR_DEPARTMENT_OTHER)
Admission: EM | Admit: 2019-02-01 | Discharge: 2019-02-01 | Disposition: A | Discharge status: Home / Self Care | Payer: BC Managed Care – PPO | Attending: Emergency Medicine | Admitting: Emergency Medicine

## 2019-02-01 ENCOUNTER — Other Ambulatory Visit: Payer: Self-pay

## 2019-02-01 DIAGNOSIS — F17210 Nicotine dependence, cigarettes, uncomplicated: Secondary | ICD-10-CM | POA: Insufficient documentation

## 2019-02-01 DIAGNOSIS — M549 Dorsalgia, unspecified: Secondary | ICD-10-CM | POA: Diagnosis present

## 2019-02-01 DIAGNOSIS — J45909 Unspecified asthma, uncomplicated: Secondary | ICD-10-CM | POA: Insufficient documentation

## 2019-02-01 DIAGNOSIS — Z79899 Other long term (current) drug therapy: Secondary | ICD-10-CM | POA: Insufficient documentation

## 2019-02-01 DIAGNOSIS — M545 Low back pain, unspecified: Secondary | ICD-10-CM

## 2019-02-01 MED ORDER — LIDOCAINE 5 % EX PTCH: 1.0000 | MEDICATED_PATCH | CUTANEOUS | 0 refills | Status: AC

## 2019-02-01 MED ORDER — IBUPROFEN 800 MG PO TABS: 800.0000 mg | ORAL_TABLET | Freq: Three times a day (TID) | ORAL | 0 refills | Status: AC | PRN

## 2019-02-01 MED ORDER — CYCLOBENZAPRINE HCL 10 MG PO TABS: 10.0000 mg | ORAL_TABLET | Freq: Two times a day (BID) | ORAL | 0 refills | Status: AC | PRN

## 2019-02-01 MED ORDER — KETOROLAC TROMETHAMINE 60 MG/2ML IM SOLN
60.0000 mg | Freq: Once | INTRAMUSCULAR | Status: AC
  Administered 2019-02-01: 60 mg via INTRAMUSCULAR
  Filled 2019-02-01: qty 2

## 2019-02-01 MED FILL — CYCLOBENZAPRINE HCL 10 MG T: 10 | 10 days supply | Qty: 20 | Fill #0

## 2019-02-01 MED FILL — IBUPROFEN 800 MG TAB: 800 | 10 days supply | Qty: 30 | Fill #0

## 2019-02-01 NOTE — Discharge Instructions (Signed)
Take Motrin 800 mg every 8 hours for the next 5 days and then as needed.  You can also take Tylenol 1000 mg 4 times a day as needed as well.  Do not mix Flexeril with alcohol or drugs.

## 2019-02-01 NOTE — ED Provider Notes (Addendum)
[facility] EMERGENCY DEPARTMENT Provider Note   CSN: [id number] Arrival date & time: 02/01/19  7371     History   Chief Complaint Chief Complaint  Patient presents with  . Back Pain    HPI RIYAN HAILE is a 38 y.o. male.     The history is provided by the patient.  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 Asthma   . Pneumonia     Patient Active Problem List   Diagnosis Date Noted  . Asthma 07/25/2012    Past Surgical History:  Procedure Laterality Date  . CIRCUMCISION    . FOOT SURGERY    . KNEE SURGERY          Home Medications    Prior to Admission medications   Medication Sig Start Date End Date Taking? Authorizing Provider  albuterol (PROVENTIL HFA;VENTOLIN HFA) 108 (90 BASE) MCG/ACT inhaler Inhale 2 puffs into the lungs every 6 (six) hours as needed. Patient used this medication for shortness of breath.    [provider]  benzonatate (TESSALON) 100 MG capsule Take 1 capsule (100 mg total) by mouth every 8 (eight) hours. 07/14/15   Hedges, Tinnie Gens, PA-C  cyclobenzaprine (FLEXERIL) 10 MG tablet Take 1 tablet (10 mg total) by mouth 2 (two) times daily as needed for up to 20 doses for muscle spasms. 02/01/19   Aftin Lye, DO  dexamethasone (DECADRON) 4 MG tablet Take 1 tablet (4 mg total) by mouth 2 (two) times daily. 11/22/17   Raeford Razor, MD  Fluticasone-Salmeterol (ADVAIR DISKUS) 250-50 MCG/DOSE AEPB Inhale 1 puff into the lungs every 12 (twelve) hours.    [provider]   HYDROcodone-acetaminophen (NORCO) 5-325 MG per tablet Take 1 tablet by mouth every 6 (six) hours as needed. 09/30/13   Palumbo, April, MD  ibuprofen (ADVIL) 800 MG tablet Take 1 tablet (800 mg total) by mouth every 8 (eight) hours as needed for up to 30 doses. 02/01/19   Mallie Giambra, DO  lidocaine (LIDODERM) 5 % Place 1 patch onto the skin daily for 30 doses. Remove & Discard patch within 12 hours or as directed by MD 02/01/19 03/03/19  Jozelyn Kuwahara, DO  tobramycin (TOBREX) 0.3 % ophthalmic solution Place 1 drop into both eyes every 4 (four) hours. 09/04/12   Elson Areas, PA-C  traMADol (ULTRAM) 50 MG tablet Take 1 tablet (50 mg total) by mouth every 6 (six) hours as needed. 11/22/17   Raeford Razor, MD    Family History Family History  Problem Relation Age of Onset  . Hypertension Father   . Seizures Father   . Heart attack Paternal Grandfather   . Depression Paternal Grandfather     Social History Social History   Tobacco Use  . Smoking status: Current Every Day Smoker    Packs/day: 1.00    Types: Cigarettes  . Smokeless tobacco: Never Used  Substance Use Topics  . Alcohol use: Yes    Comment: rare  . Drug use: Yes    Types: Marijuana  Allergies   Patient has no known allergies.   Review of Systems Review of Systems  Constitutional: Negative for chills and fever.  HENT: Negative for ear pain and sore throat.   Eyes: Negative for pain and visual disturbance.  Respiratory: Negative for cough and shortness of breath.   Cardiovascular: Negative for chest pain and palpitations.  Gastrointestinal: Negative for abdominal pain and vomiting.  Genitourinary: Negative for dysuria and hematuria.  Musculoskeletal: Positive for back pain. Negative for arthralgias.  Skin: Negative for color change and rash.  Neurological: Negative for seizures, syncope, weakness and numbness.  All other systems reviewed and are negative.    Physical Exam Updated Vital Signs BP  129/84 (BP Location: Right Arm)   Pulse 85   Temp 98.2 F (36.8 C) (Oral)   Resp 14   Wt 82.1 kg   SpO2 100%   BMI 25.24 kg/m   Physical Exam Vitals signs and nursing note reviewed.  Constitutional:      Appearance: He is well-developed.  HENT:     Head: Normocephalic and atraumatic.  Eyes:     Conjunctiva/sclera: Conjunctivae normal.     Pupils: Pupils are equal, round, and reactive to light.  Neck:     Musculoskeletal: Normal range of motion and neck supple.  Cardiovascular:     Rate and Rhythm: Normal rate and regular rhythm.     Pulses: Normal pulses.     Heart sounds: Normal heart sounds. No murmur.  Pulmonary:     Effort: Pulmonary effort is normal. No respiratory distress.     Breath sounds: Normal breath sounds.  Abdominal:     Palpations: Abdomen is soft.     Tenderness: There is no abdominal tenderness.  Musculoskeletal: Normal range of motion.        General: Tenderness present.     Comments: No midline spinal pain, tenderness to bilateral paraspinal muscles in the lumbar spine  Skin:    General: Skin is warm and dry.  Neurological:     General: No focal deficit present.     Mental Status: He is alert and oriented to person, place, and time.     Cranial Nerves: No cranial nerve deficit.     Sensory: No sensory deficit.     Motor: No weakness.     Coordination: Coordination normal.     Gait: Gait normal.     Comments: 5+ out of 5 strength throughout, normal sensation, antalgic gait      ED Treatments / Results  Labs (all labs ordered are listed, but only abnormal results are displayed) Labs Reviewed - No data to display  EKG None  Radiology No results found.  Procedures Procedures (including critical care time)  Medications Ordered in ED Medications  ketorolac (TORADOL) injection 60 mg (has no administration in time range)     Initial Impression / Assessment and Plan / ED Course  I have reviewed the triage vital signs and the nursing  notes.  Pertinent labs & imaging results that were available during my care of the patient were reviewed by me and considered in my medical decision making (see chart for details).     BRITAIN SABER is a 38 year old male with no significant medical history presents the ED with back pain.  Patient with normal vitals.  No fever.  Patient works Retail buyer job.  Increased back spasms over the last 2 weeks.  Has gotten slightly better with Motrin but worse this morning.  Denies any red flag  symptoms including no fever, no chills, no IV drug use, no loss of bowel or bladder, no saddle anesthesia.  Patient with normal neurological exam.  No concern for epidural abscess or cauda equina.  Tenderness in the paraspinal muscles of the lumbar spine likely consistent with a muscle spasm.  No midline spinal pain.  No history of specific trauma.  Given Toradol shot while in the ED.  Will prescribe Motrin, Flexeril, lidocaine patch.  Recommend follow-up with primary care doctor.  Given return precautions and discharged from the ED in good condition.  This chart was dictated using voice recognition software.  Despite best efforts to proofread,  errors can occur which can change the documentation meaning.    Final Clinical Impressions(s) / ED Diagnoses   Final diagnoses:  Acute low back pain without sciatica, unspecified back pain laterality    ED Discharge Orders         Ordered    ibuprofen (ADVIL) 800 MG tablet  Every 8 hours PRN     02/01/19 1009    cyclobenzaprine (FLEXERIL) 10 MG tablet  2 times daily PRN     02/01/19 1009    lidocaine (LIDODERM) 5 %  Every 24 hours     02/01/19 1009           Eola Waldrep, DO 02/01/19 1012    Jia Dottavio, DO 02/01/19 1013

## 2019-02-01 NOTE — ED Triage Notes (Signed)
Back pain for 2 weeks.  Denies injury.

## 2022-12-25 ENCOUNTER — Encounter (HOSPITAL_BASED_OUTPATIENT_CLINIC_OR_DEPARTMENT_OTHER): Payer: Self-pay | Admitting: *Deleted

## 2022-12-25 ENCOUNTER — Other Ambulatory Visit: Payer: Self-pay

## 2022-12-25 ENCOUNTER — Emergency Department (HOSPITAL_BASED_OUTPATIENT_CLINIC_OR_DEPARTMENT_OTHER): Payer: Managed Care, Other (non HMO)

## 2022-12-25 DIAGNOSIS — S9001XA Contusion of right ankle, initial encounter: Secondary | ICD-10-CM | POA: Diagnosis not present

## 2022-12-25 DIAGNOSIS — S99911A Unspecified injury of right ankle, initial encounter: Secondary | ICD-10-CM | POA: Diagnosis present

## 2022-12-25 DIAGNOSIS — Y93H2 Activity, gardening and landscaping: Secondary | ICD-10-CM | POA: Insufficient documentation

## 2022-12-25 DIAGNOSIS — W208XXA Other cause of strike by thrown, projected or falling object, initial encounter: Secondary | ICD-10-CM | POA: Insufficient documentation
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
## 2022-12-25 NOTE — ED Notes (Signed)
Conferred w/ RN, patient imaging can be done once in an exam room.

## 2022-12-26 ENCOUNTER — Emergency Department (HOSPITAL_BASED_OUTPATIENT_CLINIC_OR_DEPARTMENT_OTHER)
Admission: EM | Admit: 2022-12-26 | Discharge: 2022-12-26 | Disposition: A | Discharge status: Home / Self Care | Payer: Managed Care, Other (non HMO) | Attending: Emergency Medicine | Admitting: Emergency Medicine

## 2022-12-26 DIAGNOSIS — S9001XA Contusion of right ankle, initial encounter: Secondary | ICD-10-CM

## 2022-12-26 NOTE — ED Provider Notes (Signed)
   [place] [facility]  Provider Note  CSN: [id number] Arrival date & time: 12/25/22 2313  History Chief Complaint  Patient presents with   Ankle Pain    Juan Mann is a 42 y.o. male reports earlier in the day of arrival he was doing some yardwork in a gully when a brick thrown down by his father accidentally hit him in the R lateral malleolus. He has had pain in that area since with difficulty walking.    Home Medications Prior to Admission medications   Medication Sig Start Date End Date Taking? Authorizing Provider  albuterol (PROVENTIL HFA;VENTOLIN HFA) 108 (90 BASE) MCG/ACT inhaler Inhale 2 puffs into the lungs every 6 (six) hours as needed. Patient used this medication for shortness of breath.    [provider]  cyclobenzaprine (FLEXERIL) 10 MG tablet Take 1 tablet (10 mg total) by mouth 2 (two) times daily as needed for up to 20 doses for muscle spasms. 02/01/19   Curatolo, Adam, DO  ibuprofen (ADVIL) 800 MG tablet Take 1 tablet (800 mg total) by mouth every 8 (eight) hours as needed for up to 30 doses. 02/01/19   Virgina Norfolk, DO     Allergies    Patient has no known allergies.   Review of Systems   Review of Systems Please see HPI for pertinent positives and negatives  Physical Exam BP 135/87   Pulse 73   Temp 98.2 F (36.8 C) (Oral)   Resp 14   SpO2 99%   Physical Exam Vitals and nursing note reviewed.  HENT:     Head: Normocephalic.     Nose: Nose normal.  Eyes:     Extraocular Movements: Extraocular movements intact.  Pulmonary:     Effort: Pulmonary effort is normal.  Musculoskeletal:        General: Tenderness (R lateral malleolus) present. No swelling or deformity. Normal range of motion.     Cervical back: Neck supple.  Skin:    Findings: No rash (on exposed skin).  Neurological:     Mental Status: He is alert and oriented to person, place, and time.  Psychiatric:        Mood and Affect: Mood  normal.     ED Results / Procedures / Treatments   EKG None  Procedures Procedures  Medications Ordered in the ED Medications - No data to display  Initial Impression and Plan  Patient here with injury to R ankle. Exam is reassuring. I personally viewed the images from radiology studies and agree with radiologist interpretation: Xray is negative. Plan ASO for comfort. Recommend ice, elevation and rest. APAP/Motrin for pain. PCP follow up, RTED for any other concerns.    ED Course       MDM Rules/Calculators/A&P Medical Decision Making Problems Addressed: Contusion of right ankle, initial encounter: acute illness or injury  Amount and/or Complexity of Data Reviewed Radiology: ordered and independent interpretation performed. Decision-making details documented in ED Course.  Risk OTC drugs.     Final Clinical Impression(s) / ED Diagnoses Final diagnoses:  Contusion of right ankle, initial encounter    Rx / DC Orders ED Discharge Orders     None        Pollyann Savoy, MD 12/26/22 [phone]

## 2023-12-14 ENCOUNTER — Other Ambulatory Visit: Payer: Self-pay

## 2023-12-14 ENCOUNTER — Emergency Department (HOSPITAL_BASED_OUTPATIENT_CLINIC_OR_DEPARTMENT_OTHER)
Admission: EM | Admit: 2023-12-14 | Discharge: 2023-12-14 | Disposition: A | Discharge status: Home / Self Care | Attending: Emergency Medicine | Admitting: Emergency Medicine

## 2023-12-14 ENCOUNTER — Emergency Department (HOSPITAL_BASED_OUTPATIENT_CLINIC_OR_DEPARTMENT_OTHER)

## 2023-12-14 ENCOUNTER — Encounter (HOSPITAL_BASED_OUTPATIENT_CLINIC_OR_DEPARTMENT_OTHER): Payer: Self-pay | Admitting: Emergency Medicine

## 2023-12-14 DIAGNOSIS — J45909 Unspecified asthma, uncomplicated: Secondary | ICD-10-CM | POA: Diagnosis not present

## 2023-12-14 DIAGNOSIS — F17210 Nicotine dependence, cigarettes, uncomplicated: Secondary | ICD-10-CM | POA: Diagnosis not present

## 2023-12-14 DIAGNOSIS — M79672 Pain in left foot: Secondary | ICD-10-CM | POA: Insufficient documentation

## 2023-12-14 MED ORDER — CELECOXIB 200 MG PO CAPS: 200.0000 mg | ORAL_CAPSULE | Freq: Two times a day (BID) | ORAL | 0 refills | Status: AC | PRN

## 2023-12-14 NOTE — ED Notes (Signed)
 Reviewed discharge instructions, follow up and medications with pt. Pt states understanding. Ambulatory at discharge

## 2023-12-14 NOTE — Discharge Instructions (Signed)
 As discussed, your workup today was reassuring.  X-ray was negative for any acute bony abnormality.  Suspect that the pain is likely from the boots that you are wearing.  Recommend continuing to use the new shoes that you have.  Will send in medicine for pain inflammation.  Recommend icing your left foot to help with the pain and mild swelling as well.  Recommend follow-up with your primary care for reassessment.

## 2023-12-14 NOTE — ED Provider Notes (Signed)
 [place] [facility] AT [facility] [place] Provider Note   CSN: [id number] Arrival date & time: 12/14/23  8594     Patient presents with: Foot Pain   Juan Mann is a 43 y.o. male.    Foot Pain   43 year old male presents emergency department complaints of left foot pain.  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  States that he worked 60 hours on his feet feels like the steel toed boots caused his foot pain.  Reports pain mainly on the top of his foot up into his ankle.  Denies any known falls/traumas/injury.  Has been wearing ankle brace as well as more athletic shoes but due to persistence of symptoms, prompted visit to the emergency department.  Denies any weakness or sensory deficits in affected leg.  Past medical history significant for asthma, pneumonia  Prior to Admission medications   Medication Sig Start Date End Date Taking? Authorizing Provider  albuterol  (PROVENTIL  HFA;VENTOLIN  HFA) 108 (90 BASE) MCG/ACT inhaler Inhale 2 puffs into the lungs every 6 (six) hours as needed. Patient used this medication for shortness of breath.    [provider]  cyclobenzaprine  (FLEXERIL ) 10 MG tablet Take 1 tablet (10 mg total) by mouth 2 (two) times daily as needed for up to 20 doses for muscle spasms. 02/01/19   Curatolo, Adam, DO  ibuprofen  (ADVIL ) 800 MG tablet Take 1 tablet (800 mg total) by mouth every 8 (eight) hours as needed for up to 30 doses. 02/01/19   Ruthe Cornet, DO    Allergies: Patient has no known allergies.    Review of Systems  All other systems reviewed and are negative.   Updated Vital Signs BP (!) 148/90   Pulse 88   Temp 98.1 F (36.7 C) (Oral)   Resp 18   Ht 5' 11 (1.803 m)   Wt 86.2 kg   SpO2 100%   BMI 26.50 kg/m   Physical Exam Vitals and nursing note reviewed.  Constitutional:      General: He is not in acute distress.    Appearance: He is  well-developed.  HENT:     Head: Normocephalic and atraumatic.  Eyes:     Conjunctiva/sclera: Conjunctivae normal.  Cardiovascular:     Rate and Rhythm: Normal rate and regular rhythm.     Heart sounds: No murmur heard. Pulmonary:     Effort: Pulmonary effort is normal. No respiratory distress.     Breath sounds: Normal breath sounds.  Abdominal:     Palpations: Abdomen is soft.     Tenderness: There is no abdominal tenderness.  Musculoskeletal:        General: No swelling.     Cervical back: Neck supple.     Comments: Full range of motion of left ankle/digits of left foot.  Pedal and posterior tibial pulses 2+ bilaterally.  No overlying erythema, palpable fluctuance/induration.  Patient with tenderness along dorsal aspect of foot with mild swelling, ecchymosis appreciated.  Tender palpation base of fifth metatarsal.  No obvious malleoli or tenderness.  Skin:    General: Skin is warm and dry.     Capillary Refill: Capillary refill takes less than 2 seconds.  Neurological:     Mental Status: He is alert.  Psychiatric:        Mood and Affect: Mood normal.     (all labs ordered are listed, but only abnormal results are displayed) Labs Reviewed -  No data to display  EKG: None  Radiology: No results found.   Procedures   Medications Ordered in the ED - No data to display                                  Medical Decision Making Amount and/or Complexity of Data Reviewed Radiology: ordered.   This patient presents to the ED for concern of foot pain, this involves an extensive number of treatment options, and is a complaint that carries with it a high risk of complications and morbidity.  The differential diagnosis includes fracture, strain/sprain, dislocation, ligamentous/tendinous injury, neurovasc compromise, septic arthritis, osteoarthritis, gout, other   Co morbidities that complicate the patient evaluation  See HPI   Additional history obtained:  Additional  history obtained from EMR External records from outside source obtained and reviewed including hospital records   Lab Tests:  N/a   Imaging Studies ordered:  I ordered imaging studies including left foot x-ray I independently visualized and interpreted imaging which showed no acute abnormality I agree with the radiologist interpretation   Cardiac Monitoring: / EKG:  N/a   Consultations Obtained:  N/a   Problem List / ED Course / Critical interventions / Medication management  Left foot pain Reevaluation of the patient showed that the patient stayed the same I have reviewed the patients home medicines and have made adjustments as needed   Social Determinants of Health:  Cigarette use.  Denies illicit drug use.   Test / Admission - Considered:  Left foot pain Vitals signs significant for hypertension blood pressure 148/90. Otherwise within normal range and stable throughout visit. Imaging studies significant for: See above 43 year old male presents emergency department complaints of left foot pain.  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  States that he worked 60 hours on his feet feels like the steel toed boots caused his foot pain.  Reports pain mainly on the top of his foot up into his ankle.  Denies any known falls/traumas/injury.  Has been wearing ankle brace as well as more athletic shoes but due to persistence of symptoms, prompted visit to the emergency department.  Denies any weakness or sensory deficits in affected leg. On exam, mild swelling as well as faint ecchymosis appreciated to dorsal aspect of left foot with overlying tenderness.  No lower extremity edema.  Full range of motion of left foot/ankle.  Intact pulses.  No overlying skin changes concerning for secondary infectious process.  X-ray obtained which was negative.  Suspect the patient's foot pain is likely from the fitting of his boot as  symptoms seem to revolve around wearing the new boots for prolonged amount of time.  Will recommend changing orthotics, symptomatic therapy as an AVS follow-up with primary care.  Treatment plan discussed with patient he denies understanding was agreeable.  Patient well-appearing, afebrile in no acute distress. Worrisome signs and symptoms were discussed with the patient, and the patient acknowledged understanding to return to the ED if noticed. Patient was stable upon discharge.       Final diagnoses:  None    ED Discharge Orders     None          Silver Wonda LABOR, [place] 12/14/23 1451    Cottie Donnice PARAS, MD 12/14/23 [phone]

## 2023-12-14 NOTE — ED Triage Notes (Signed)
 Pt c/o L foot pain x appx 1.5 wk, pain is worsening, now having difficulty bearing weight. Reports swelling and discoloration. Denies known injury.
